# Patient Record
Sex: Male | Born: 1950 | Race: White | Hispanic: No | Marital: Married | Smoking: Never smoker
Health system: Southern US, Community
[De-identification: ages and names within clinical notes are randomized; demographics above are authoritative.]

## PROBLEM LIST (undated history)

## (undated) DIAGNOSIS — Z973 Presence of spectacles and contact lenses: Secondary | ICD-10-CM

## (undated) DIAGNOSIS — N411 Chronic prostatitis: Secondary | ICD-10-CM

## (undated) DIAGNOSIS — R972 Elevated prostate specific antigen [PSA]: Secondary | ICD-10-CM

## (undated) DIAGNOSIS — E291 Testicular hypofunction: Secondary | ICD-10-CM

## (undated) DIAGNOSIS — K2270 Barrett's esophagus without dysplasia: Secondary | ICD-10-CM

## (undated) DIAGNOSIS — C4492 Squamous cell carcinoma of skin, unspecified: Secondary | ICD-10-CM

## (undated) DIAGNOSIS — N400 Enlarged prostate without lower urinary tract symptoms: Secondary | ICD-10-CM

## (undated) DIAGNOSIS — K449 Diaphragmatic hernia without obstruction or gangrene: Secondary | ICD-10-CM

## (undated) DIAGNOSIS — N200 Calculus of kidney: Secondary | ICD-10-CM

## (undated) DIAGNOSIS — K219 Gastro-esophageal reflux disease without esophagitis: Secondary | ICD-10-CM

## (undated) DIAGNOSIS — M1990 Unspecified osteoarthritis, unspecified site: Secondary | ICD-10-CM

## (undated) DIAGNOSIS — N419 Inflammatory disease of prostate, unspecified: Secondary | ICD-10-CM

## (undated) DIAGNOSIS — E785 Hyperlipidemia, unspecified: Secondary | ICD-10-CM

## (undated) HISTORY — DX: Hyperlipidemia, unspecified: E78.5

## (undated) HISTORY — DX: Benign prostatic hyperplasia without lower urinary tract symptoms: N40.0

## (undated) HISTORY — PX: PROSTATE SURGERY: SHX751

## (undated) HISTORY — DX: Barrett's esophagus without dysplasia: K22.70

## (undated) HISTORY — DX: Calculus of kidney: N20.0

## (undated) HISTORY — DX: Testicular hypofunction: E29.1

## (undated) HISTORY — DX: Chronic prostatitis: N41.1

## (undated) HISTORY — DX: Elevated prostate specific antigen (PSA): R97.20

## (undated) HISTORY — DX: Diaphragmatic hernia without obstruction or gangrene: K44.9

## (undated) HISTORY — PX: SHOULDER SURGERY: SHX246

## (undated) HISTORY — DX: Inflammatory disease of prostate, unspecified: N41.9

## (undated) HISTORY — PX: HERNIA REPAIR: SHX51

---

## 2003-12-09 HISTORY — PX: HERNIA REPAIR: SHX51

## 2007-03-23 ENCOUNTER — Ambulatory Visit: Payer: Self-pay | Admitting: Urology

## 2007-03-23 IMAGING — CR DG ABDOMEN 1V
1 series · 2 of 2 positions shown · non-contrast
Comparison: none

REASON FOR EXAM: xray kub nephrolithiasis pt need films
COMMENTS:

[Series 1: view not recorded · 0.17mm/px · 2 of 2 slices shown]
[im 1/2]
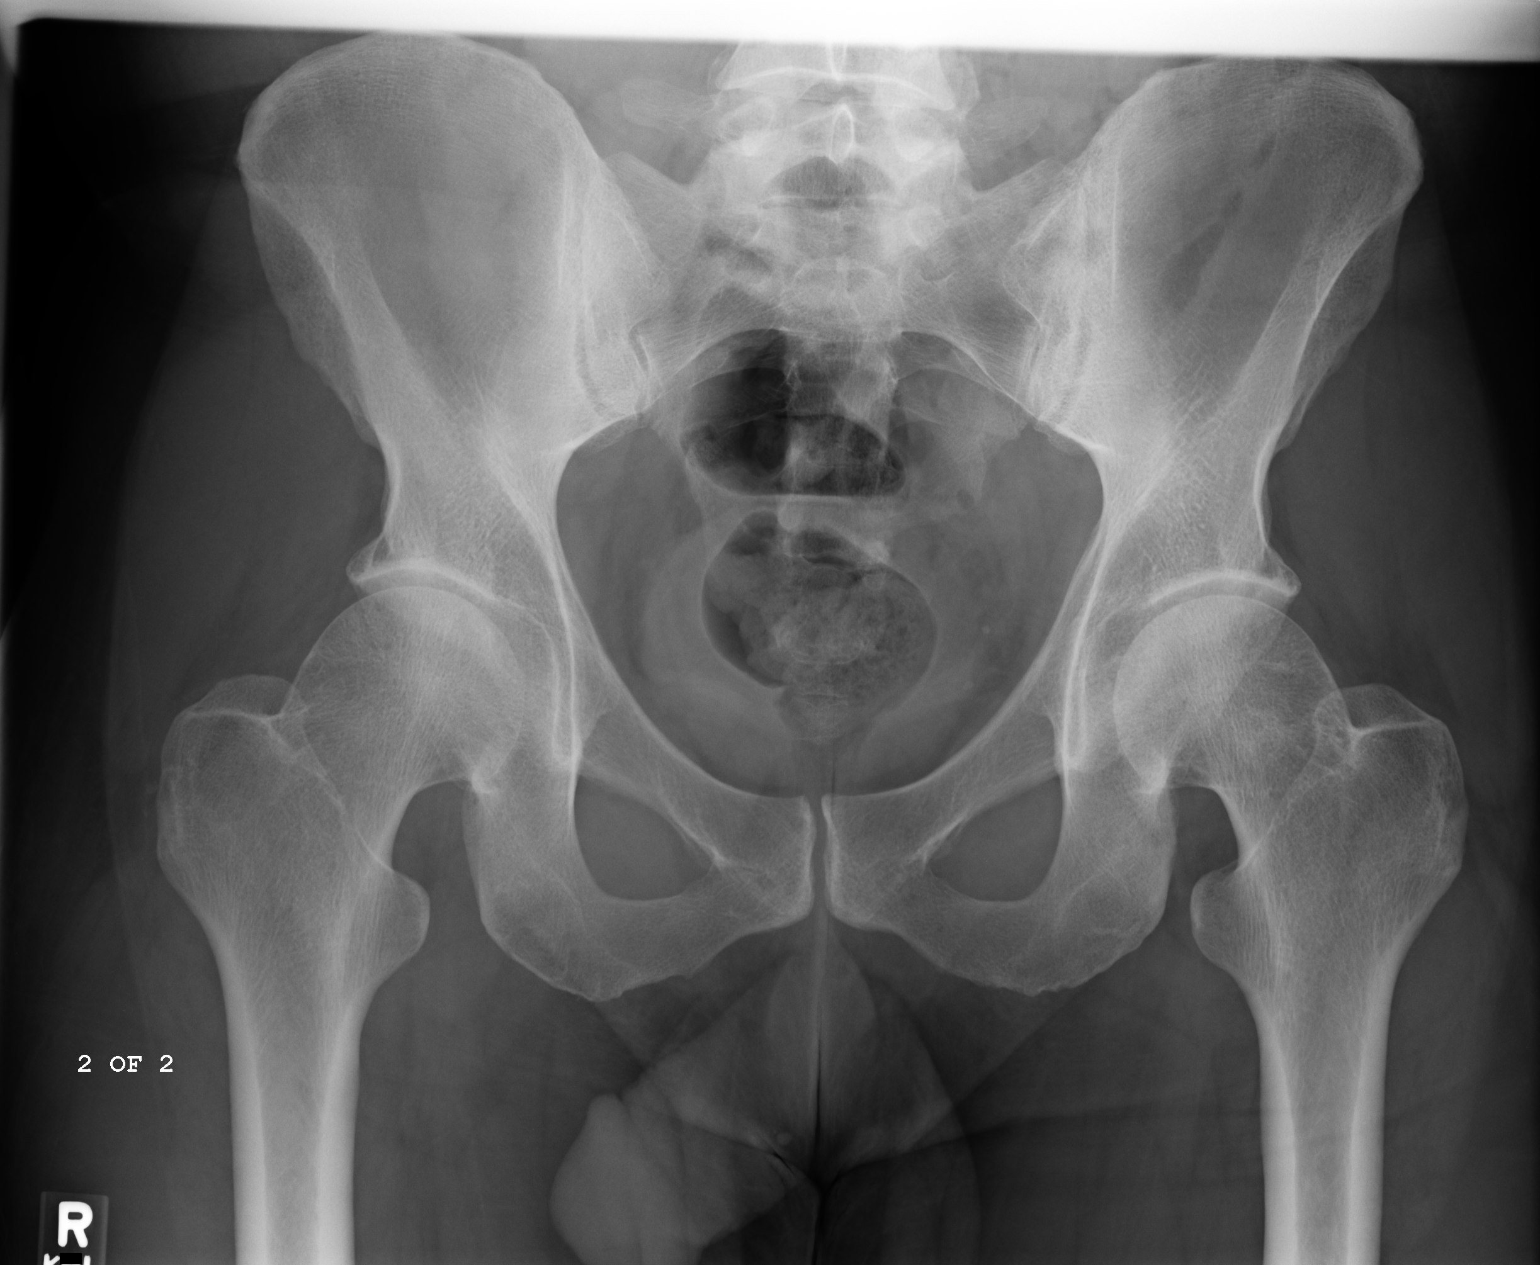
[im 2/2]
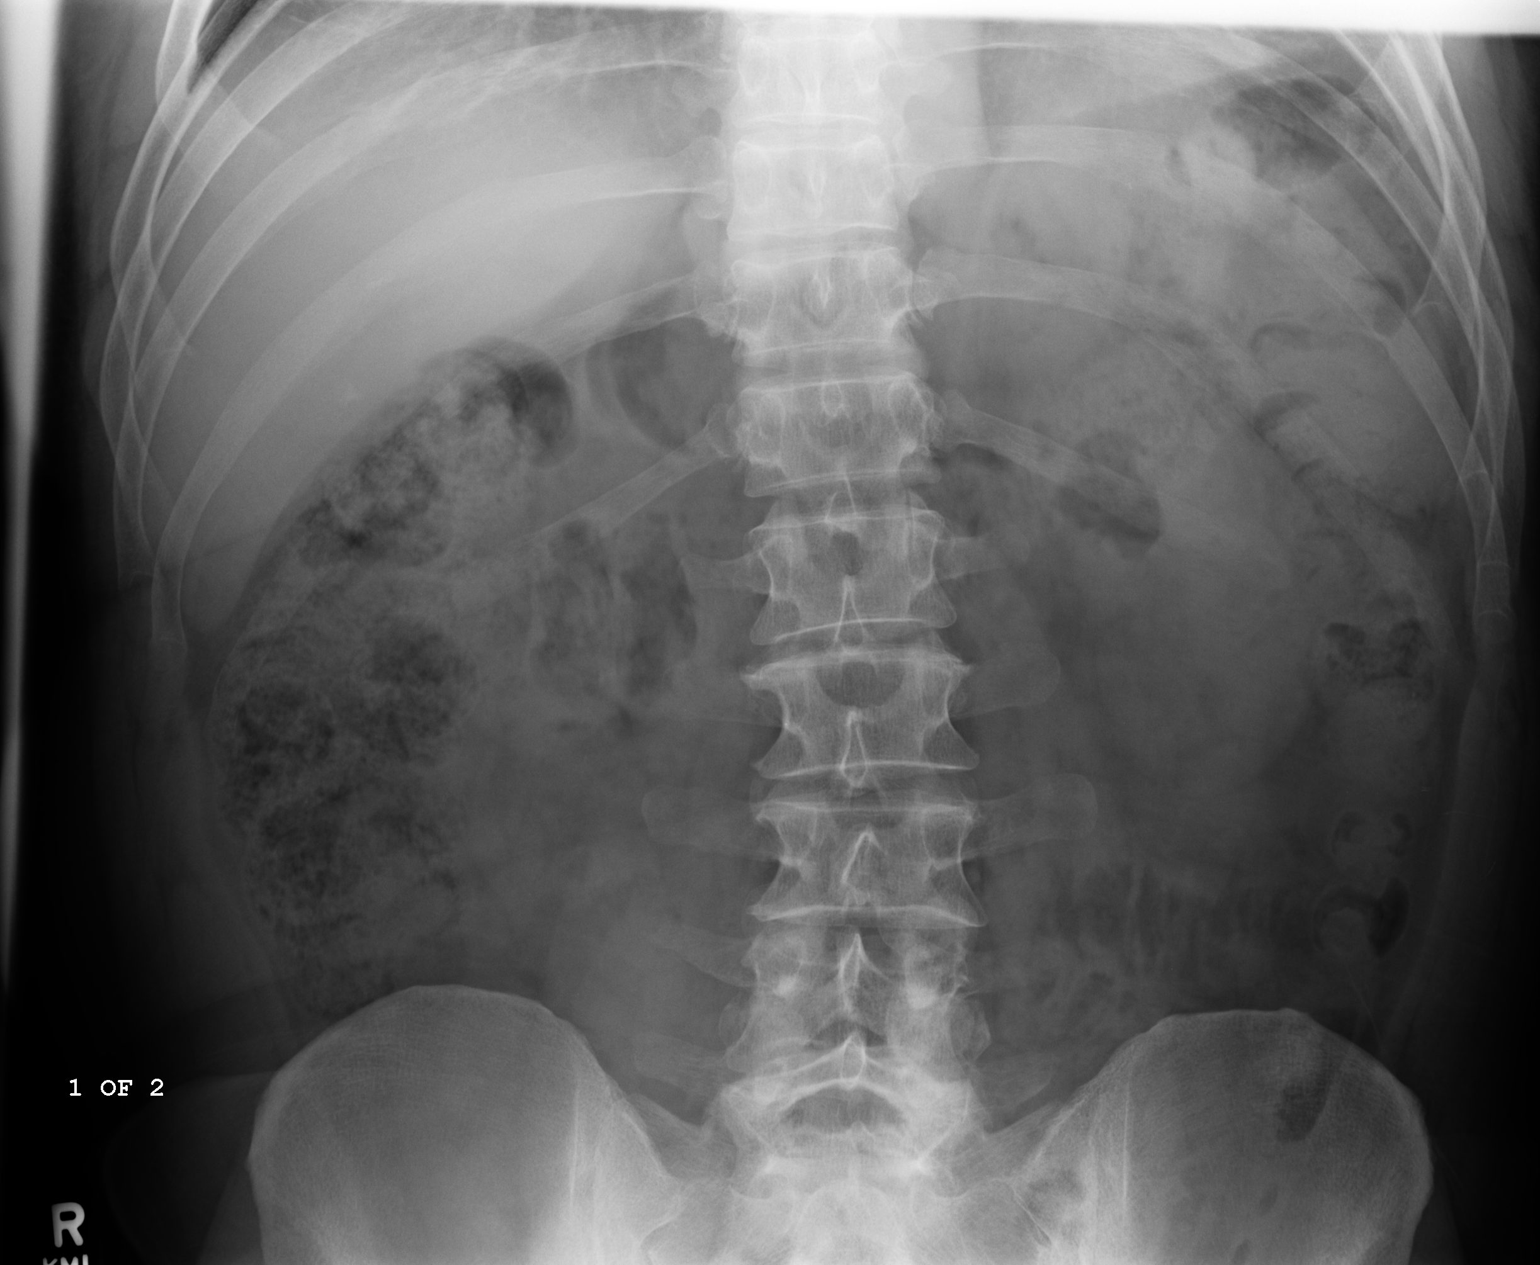

[2 of 2 positions shown; findings below may reference images not displayed]

PROCEDURE:     DXR - DXR KIDNEY URETER BLADDER  - [DATE]  [DATE]

RESULT:      AP views of the abdomen and of the pelvis were obtained. The
clinically reported nephrolithiasis is not definitely identified on plain
film examination. The reported calcifications apparently are faintly
calcified or are obscured by overlying bowel and bowel content.
IMPRESSION: Please see above.

## 2007-12-14 DIAGNOSIS — K469 Unspecified abdominal hernia without obstruction or gangrene: Secondary | ICD-10-CM | POA: Insufficient documentation

## 2008-01-26 ENCOUNTER — Encounter: Admission: RE | Admit: 2008-01-26 | Discharge: 2008-01-26 | Payer: Self-pay | Admitting: General Surgery

## 2008-06-01 ENCOUNTER — Ambulatory Visit: Payer: Self-pay | Admitting: Gastroenterology

## 2010-01-09 ENCOUNTER — Emergency Department: Payer: Self-pay | Admitting: Emergency Medicine

## 2012-08-31 DIAGNOSIS — R972 Elevated prostate specific antigen [PSA]: Secondary | ICD-10-CM | POA: Insufficient documentation

## 2012-08-31 DIAGNOSIS — R339 Retention of urine, unspecified: Secondary | ICD-10-CM | POA: Insufficient documentation

## 2012-08-31 DIAGNOSIS — E291 Testicular hypofunction: Secondary | ICD-10-CM | POA: Insufficient documentation

## 2012-08-31 DIAGNOSIS — N138 Other obstructive and reflux uropathy: Secondary | ICD-10-CM | POA: Insufficient documentation

## 2012-12-29 IMAGING — CR DG ABDOMEN 1V
1 series · 2 of 2 positions shown · non-contrast
Comparison: none

REASON FOR EXAM: NEPHROLITHASIS RENAL COLIC
COMMENTS:

[Series 1: t abdomen supine · 0.14mm/px · 2 of 2 slices shown]
[im 1/2]
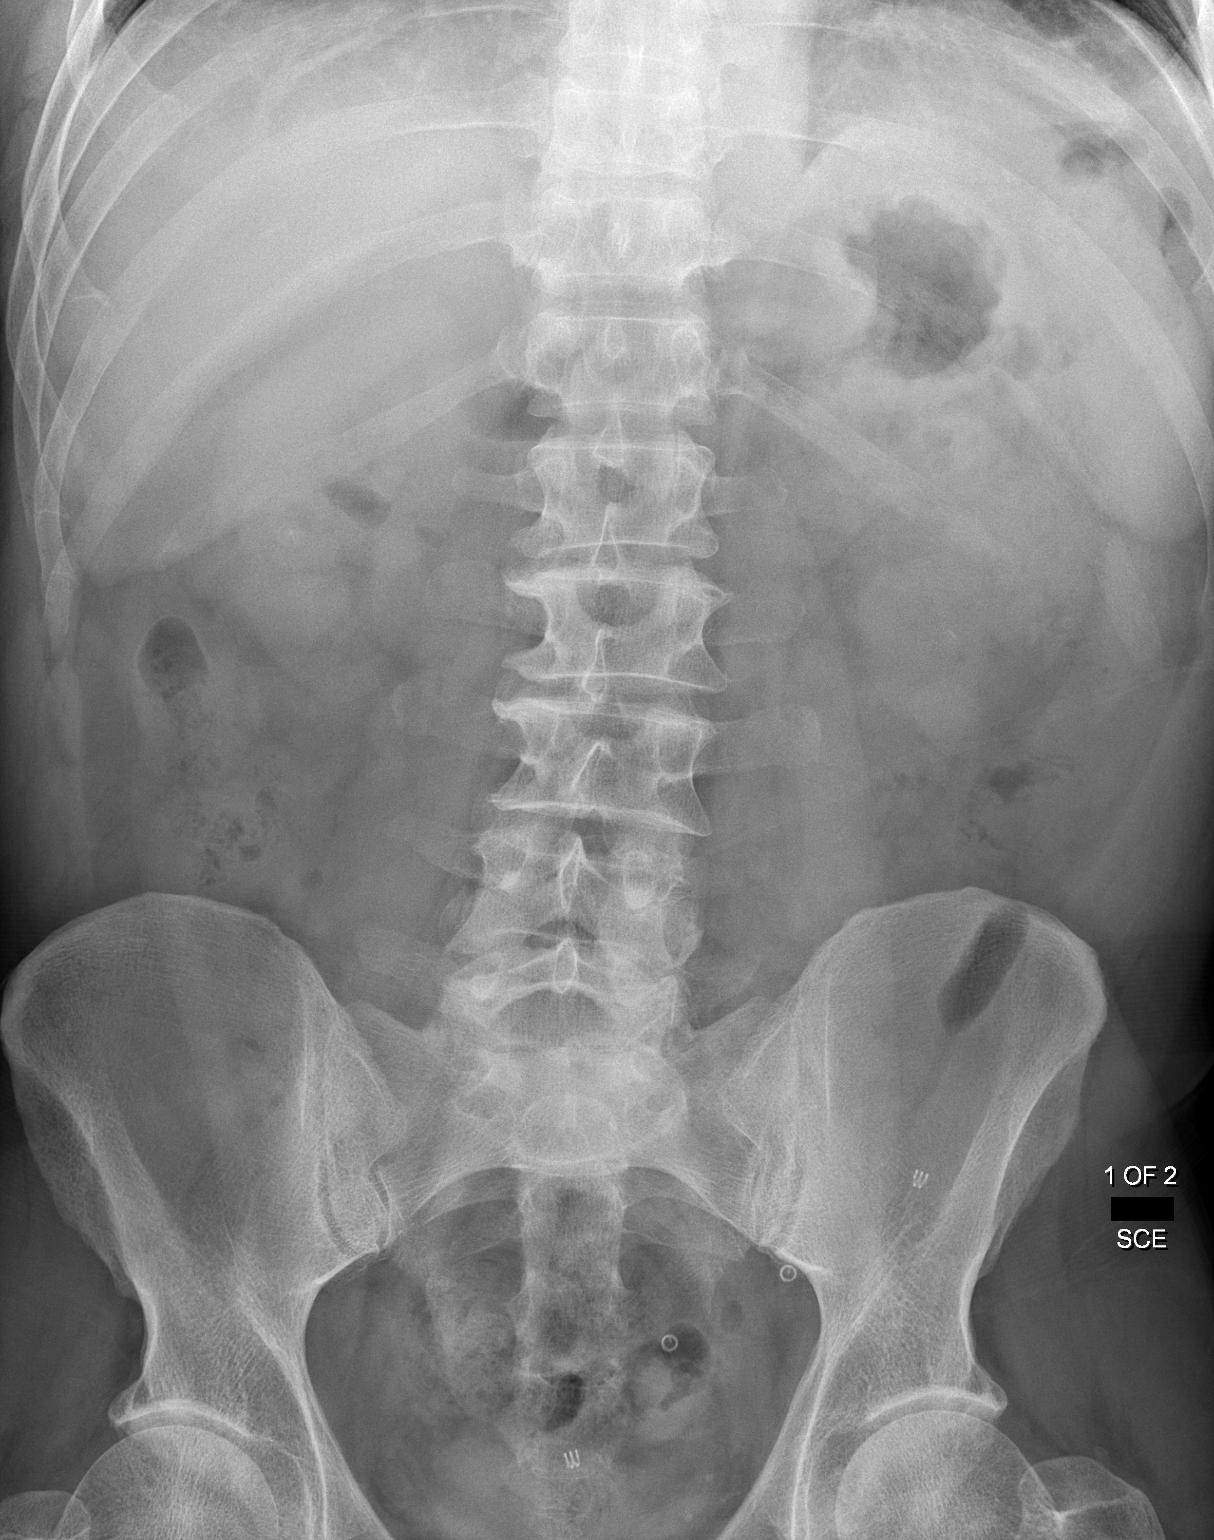
[im 2/2]
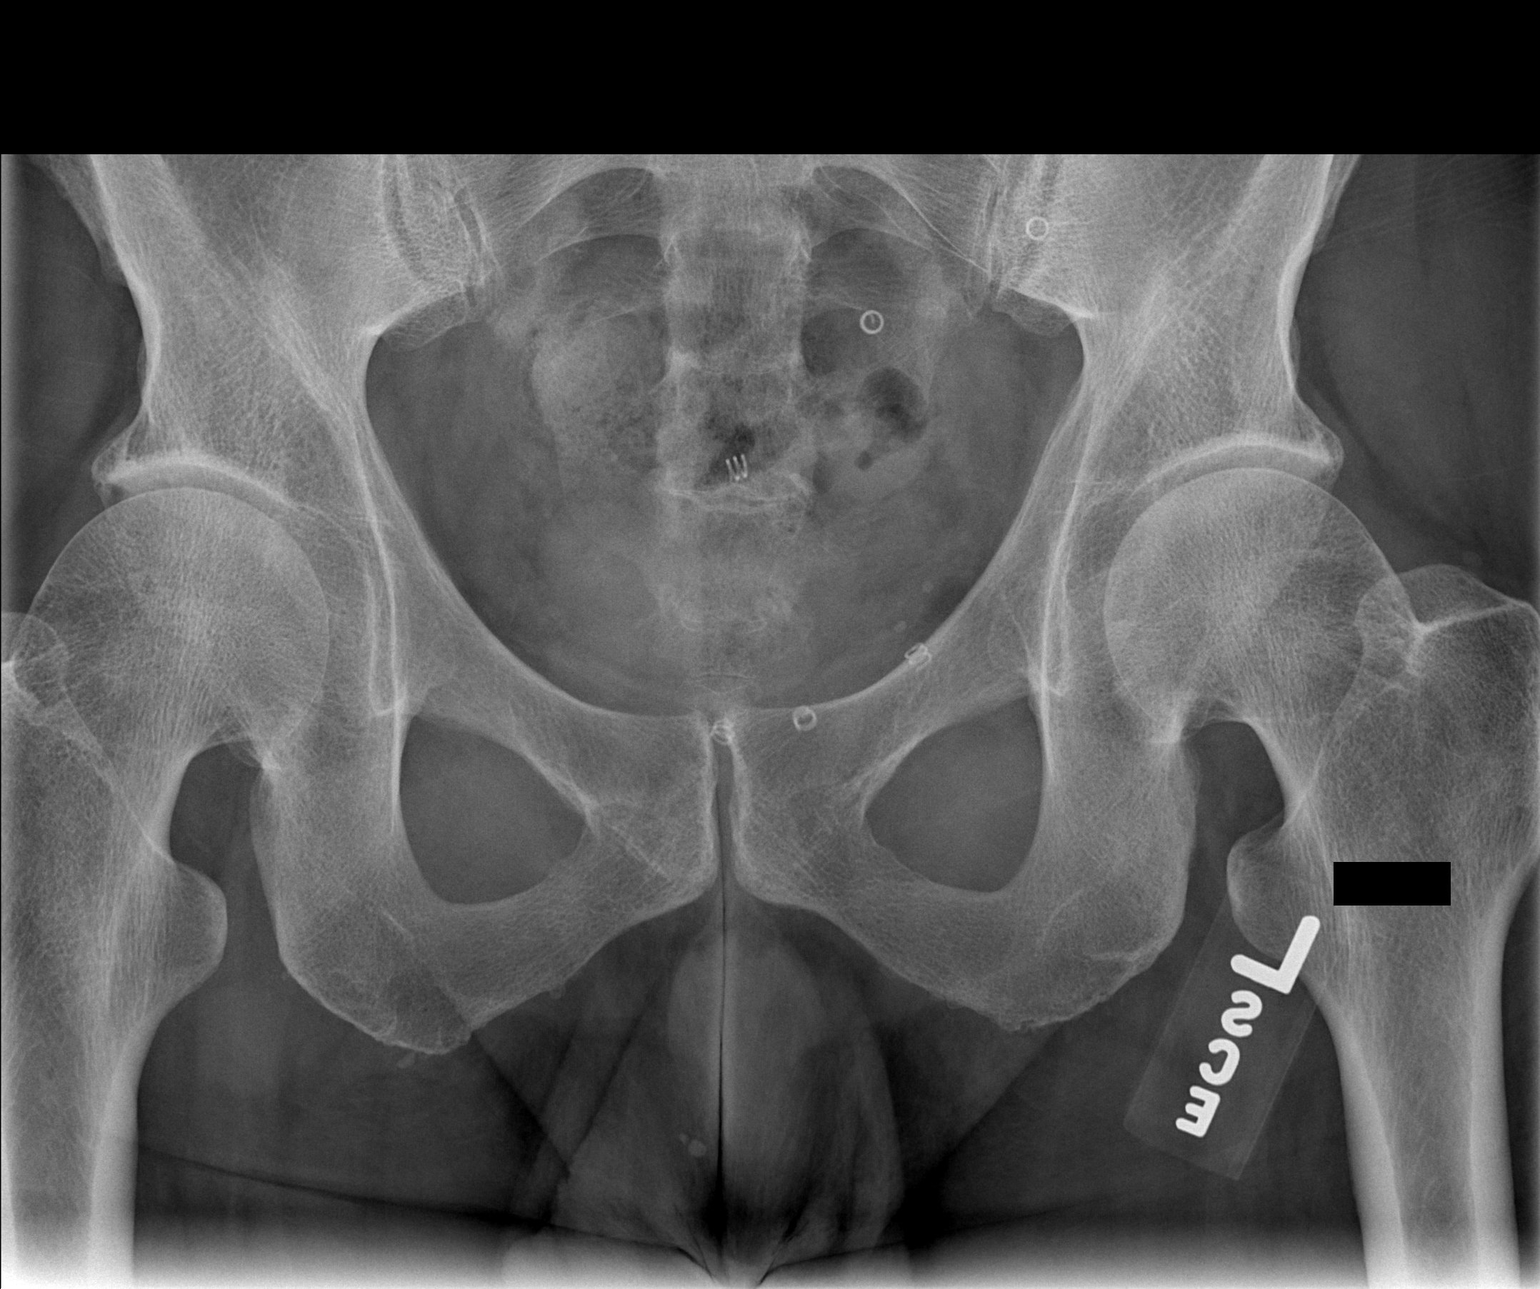

[2 of 2 positions shown; findings below may reference images not displayed]

PROCEDURE:     DXR - DXR KIDNEY URETER BLADDER  - [DATE]  [DATE]

RESULT:     Right nephrolithiasis and left nephrolithiasis appear present.
No definite ureteral stones are evident. Coils are present over the left
pelvis from previous hernia repair surgery. The bowel gas pattern is
unremarkable. Bony structures appear unremarkable.
IMPRESSION: 1. I lateral nephrolithiasis. No definite ureterolithiasis.

[REDACTED]

## 2013-04-07 ENCOUNTER — Ambulatory Visit: Payer: Self-pay | Admitting: Urology

## 2013-04-07 DIAGNOSIS — N200 Calculus of kidney: Secondary | ICD-10-CM | POA: Insufficient documentation

## 2013-04-07 DIAGNOSIS — N201 Calculus of ureter: Secondary | ICD-10-CM | POA: Insufficient documentation

## 2013-04-07 DIAGNOSIS — R310 Gross hematuria: Secondary | ICD-10-CM | POA: Insufficient documentation

## 2013-04-07 IMAGING — CR DG ABDOMEN 1V
1 series · 2 of 2 positions shown · non-contrast
Comparison: none

REASON FOR EXAM: Nephrolithaisis
COMMENTS:

PROCEDURE:     DXR - DXR KIDNEY URETER BLADDER  - [DATE] [DATE]
RESULT:     Comparison: [DATE]

[Series 1: supine kub · 0.17mm/px · 2 of 2 slices shown]
[im 1/2]
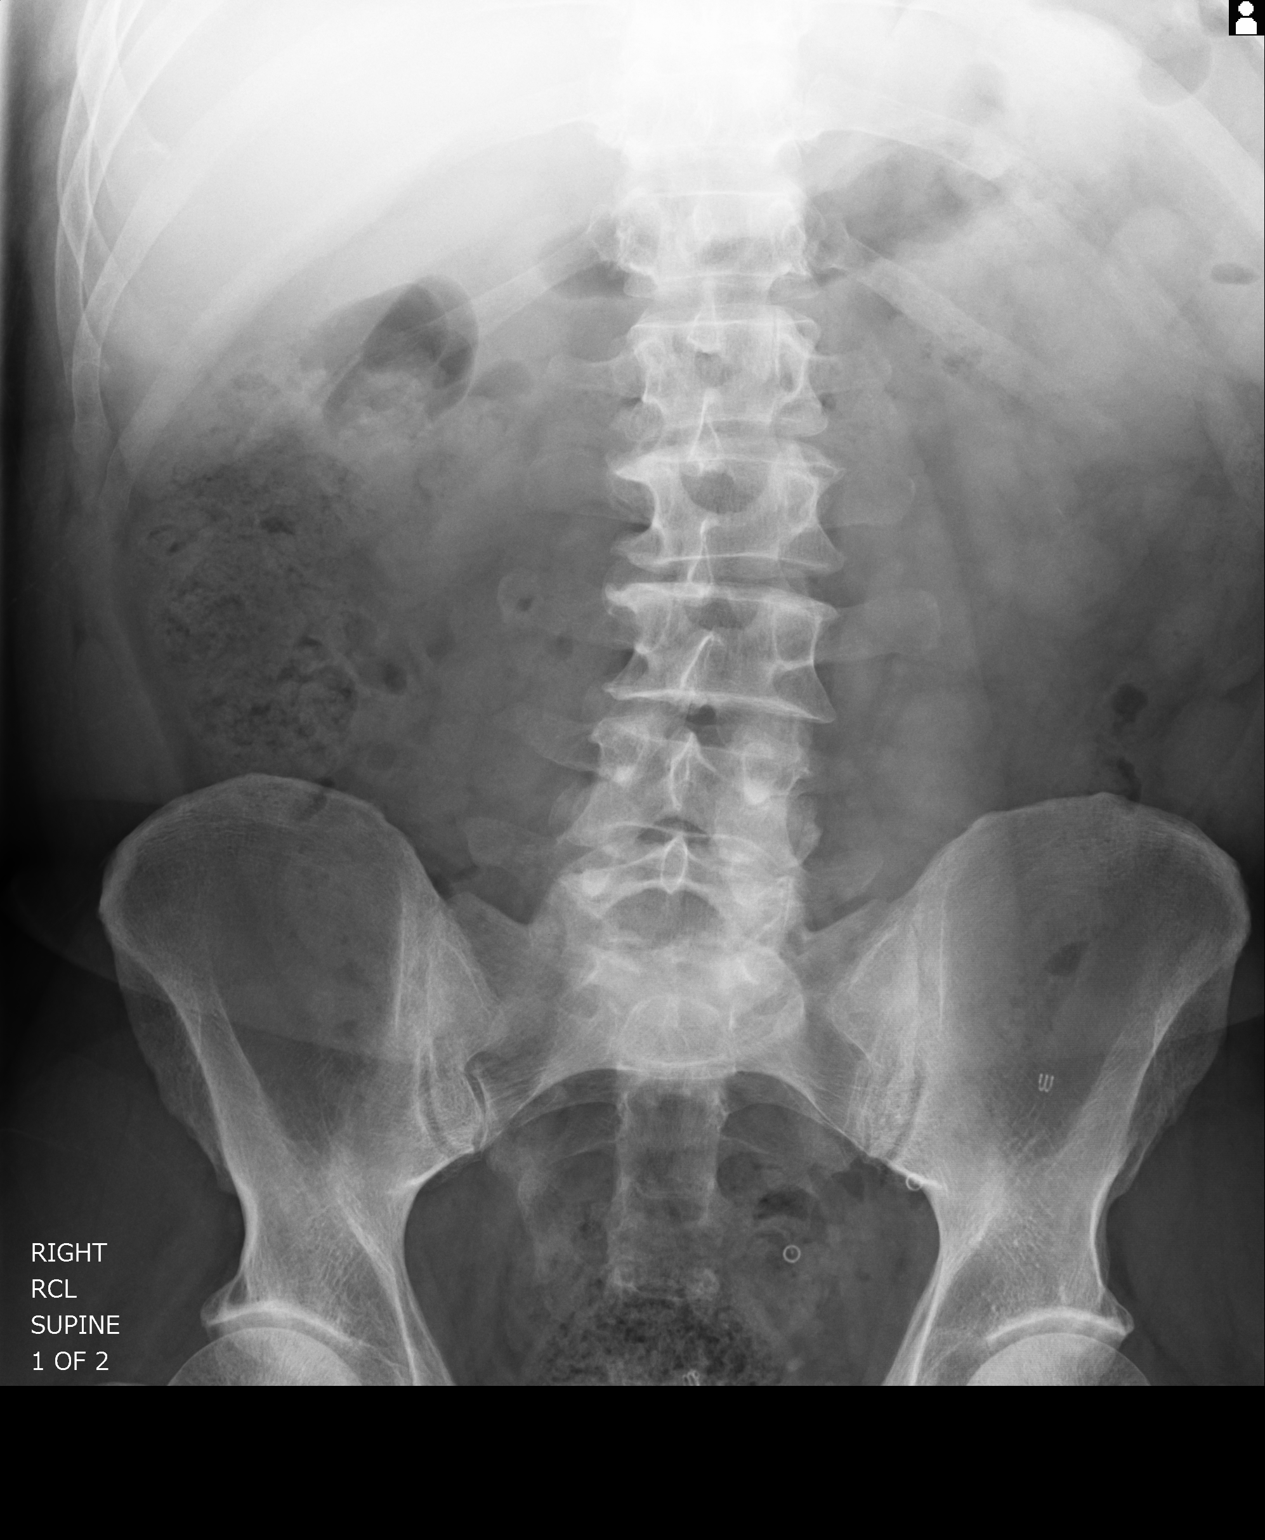
[im 2/2]
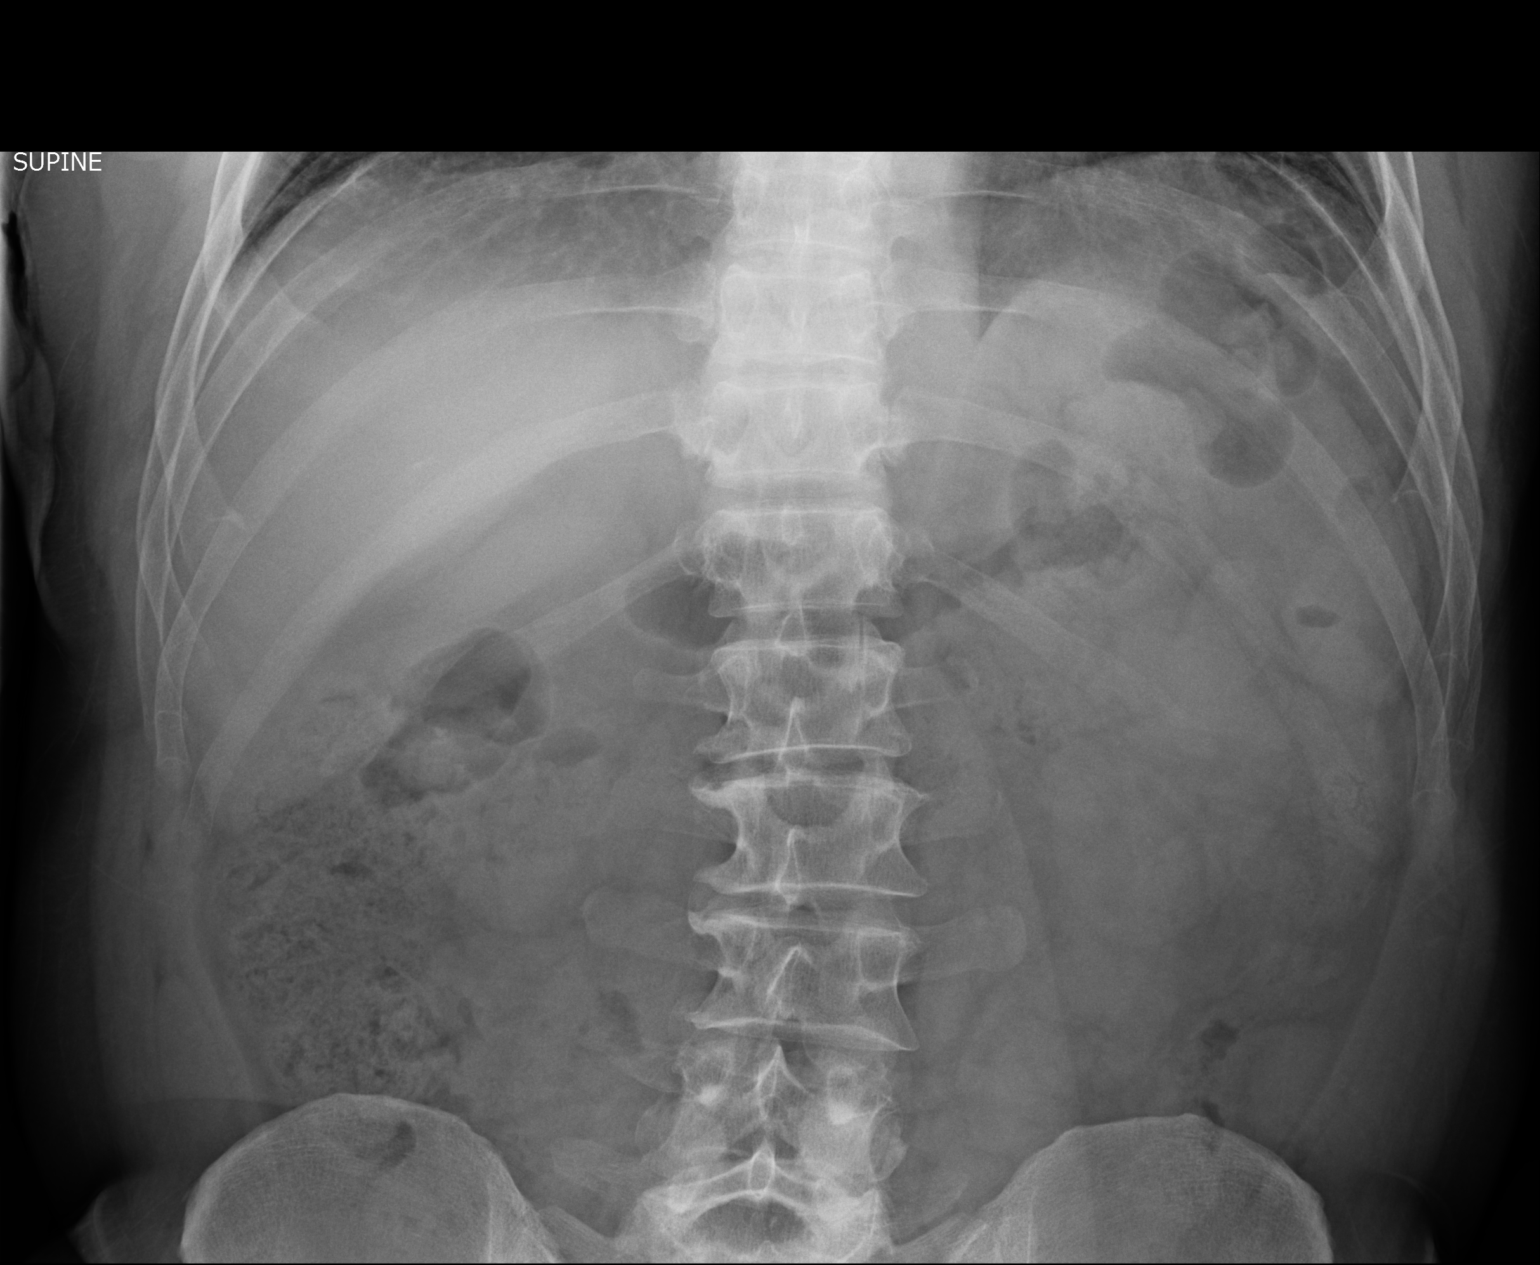

[2 of 2 positions shown; findings below may reference images not displayed]

FINDINGS: There are a few minimal densities overlying the left kidney which could
represent renal calculi. Evaluation of the right kidney is limited by
overlying stool and bowel gas. There is mild levocurvature lumbar spine.
Calcifications in the pelvis likely represent phleboliths.  A distal
ureteral calculus would be difficult to exclude.. Stool is in the rectum.
Postoperative changes seen from left hernia mesh repair.
IMPRESSION: Possible left-sided nephrolithiasis.

[REDACTED]

## 2013-04-13 ENCOUNTER — Ambulatory Visit: Payer: Self-pay | Admitting: Urology

## 2013-04-19 ENCOUNTER — Ambulatory Visit: Payer: Self-pay | Admitting: Urology

## 2013-05-27 DIAGNOSIS — S61219A Laceration without foreign body of unspecified finger without damage to nail, initial encounter: Secondary | ICD-10-CM | POA: Insufficient documentation

## 2013-07-15 ENCOUNTER — Ambulatory Visit: Payer: Self-pay | Admitting: Urology

## 2013-08-29 ENCOUNTER — Encounter (INDEPENDENT_AMBULATORY_CARE_PROVIDER_SITE_OTHER): Payer: Self-pay | Admitting: General Surgery

## 2013-08-29 ENCOUNTER — Ambulatory Visit (INDEPENDENT_AMBULATORY_CARE_PROVIDER_SITE_OTHER): Payer: No Typology Code available for payment source | Admitting: General Surgery

## 2013-08-29 ENCOUNTER — Other Ambulatory Visit (INDEPENDENT_AMBULATORY_CARE_PROVIDER_SITE_OTHER): Payer: Self-pay | Admitting: General Surgery

## 2013-08-29 VITALS — BP 124/76 | HR 68 | Temp 97.6°F | Resp 14 | Ht 70.0 in | Wt 204.8 lb

## 2013-08-29 DIAGNOSIS — R1310 Dysphagia, unspecified: Secondary | ICD-10-CM

## 2013-08-29 NOTE — Patient Instructions (Signed)
We will call you with the results of your x-ray.  You may need an upper endoscopy by a Gastroenterologist.

## 2013-08-29 NOTE — Progress Notes (Signed)
Patient ID: Lucas Christensen, male   DOB: [birth year], 62 y.o.   MRN: [id number]  Chief Complaint  Patient presents with  . New Evaluation    EPNP, eval H hernia    HPI Lucas Christensen is a 62 y.o. male.   HPI  He is referred by Lucas Christensen for further evaluation and treatment of left groin pain possible recurrent inguinal hernia. He underwent a laparoscopic left inguinal hernia repair with mesh in the past. He's been having problems with kidney stones. He underwent a CT scan which apparently showed something in the left inguinal area. I do not have access to the CT scan. He states when he exerts himself heavily he does have some discomfort in the left groin. He also complains of having some dysphagia and states he has a hiatal hernia which he thinks is shown on the CT scan as well. He does not have any heartburn.  Past Medical History  Diagnosis Date  . Elevated PSA   . BPH (benign prostatic hyperplasia)   . Chronic prostatitis   . Testicular hypofunction   . Calculus of both kidneys   . Hyperlipidemia     Past Surgical History  Procedure Laterality Date  . Shoulder surgery Left   . Hernia repair      History reviewed. No pertinent family history.  Social History History  Substance Use Topics  . Smoking status: Never Smoker   . Smokeless tobacco: Never Used  . Alcohol Use: No    No Known Allergies  Current Outpatient Prescriptions  Medication Sig Dispense Refill  . Azelaic Acid (FINACEA) 15 % cream Apply topically 2 (two) times daily. After skin is thoroughly washed and patted dry, gently but thoroughly massage a thin film of azelaic acid cream into the affected area twice daily, in the morning and evening.      . tamsulosin (FLOMAX) 0.4 MG CAPS capsule        No current facility-administered medications for this visit.    Review of Systems Review of Systems  Constitutional: Negative for unexpected weight change.  HENT: Positive for trouble swallowing.   Respiratory:  Negative.   Cardiovascular: Negative.   Gastrointestinal: Negative.     Blood pressure 124/76, pulse 68, temperature 97.6 F (36.4 C), temperature source Temporal, resp. rate 14, height 5\' 10"  (1.778 m), weight 204 lb 12.8 oz (92.897 kg).  Physical Exam Physical Exam  Constitutional: He appears well-developed and well-nourished. No distress.  Neck: Neck supple.  Cardiovascular: Normal rate and regular rhythm.   Pulmonary/Chest: Effort normal and breath sounds normal.  Abdominal: Soft. He exhibits no distension and no mass. There is no tenderness.  Genitourinary:  No left or right inguinal bulges that are consistent with a hernia  Lymphadenopathy:    He has no cervical adenopathy.    Data Reviewed Notes from Dr. Achilles Christensen. CT scan report but no CT images.  Assessment    #1. Intermittent left groin pain with heavy activity. No clinical evidence of hernia.  #2. Dysphagia     Plan    Obtain a disc of the CT scan so I can look at the images. Order a barium swallow. We'll call him and discuss results and need for further workup including possible gastroenterology consult and upper endoscopy.        Lucas Christensen J 08/29/2013, 3:44 PM

## 2013-09-05 ENCOUNTER — Ambulatory Visit
Admission: RE | Admit: 2013-09-05 | Discharge: 2013-09-05 | Disposition: A | Discharge status: Home / Self Care | Payer: No Typology Code available for payment source | Source: Ambulatory Visit | Attending: General Surgery | Admitting: General Surgery

## 2013-09-05 DIAGNOSIS — R1310 Dysphagia, unspecified: Secondary | ICD-10-CM

## 2013-09-05 IMAGING — RF DG ESOPHAGUS
8 of 9 series · 19 of 24 positions shown · non-contrast
Comparison: None.

FLUOROSCOPY TIME:  Two Min 24 seconds

CLINICAL DATA: Dysphasia

EXAM:
ESOPHOGRAM/BARIUM SWALLOW
TECHNIQUE: Combined double contrast and single contrast examination performed
using effervescent crystals, thick barium liquid, and thin barium
liquid.

[Series 1: run · 6 of 19 slices shown (1 of 8)]
[im 1/19]
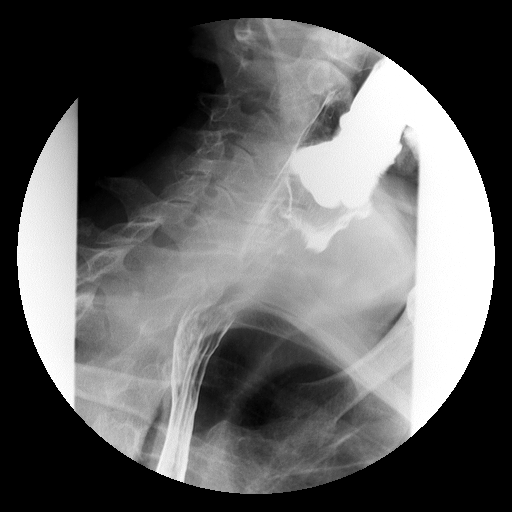
[im 3/19]
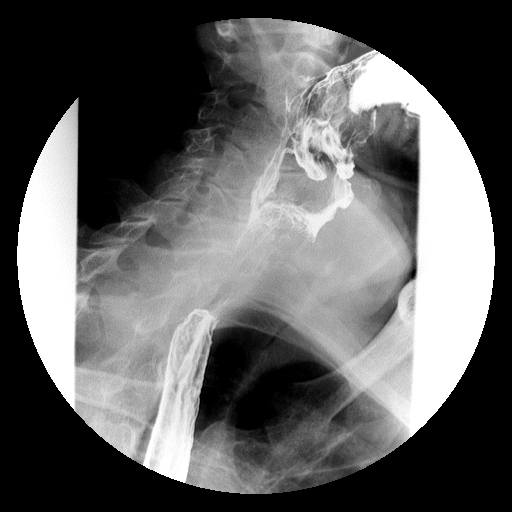
[im 8/19]
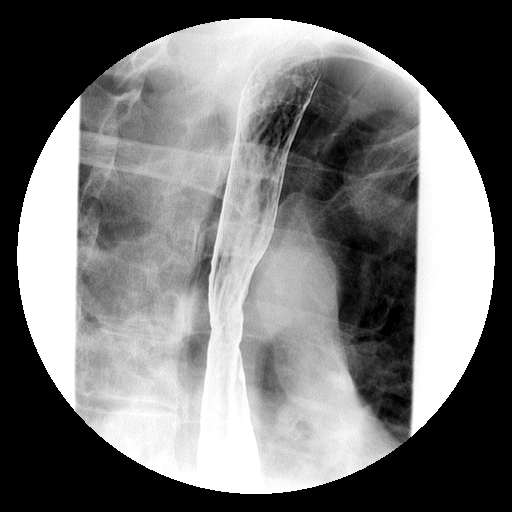
[im 11/19]
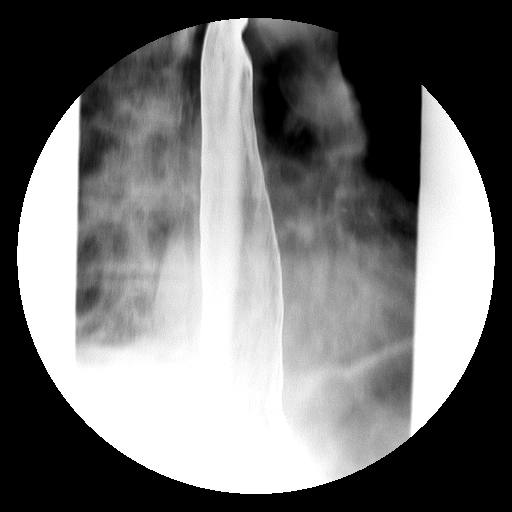
[im 13/19]
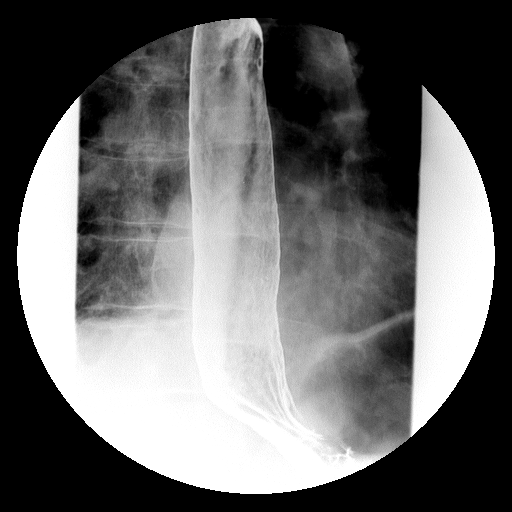
[im 16/19]
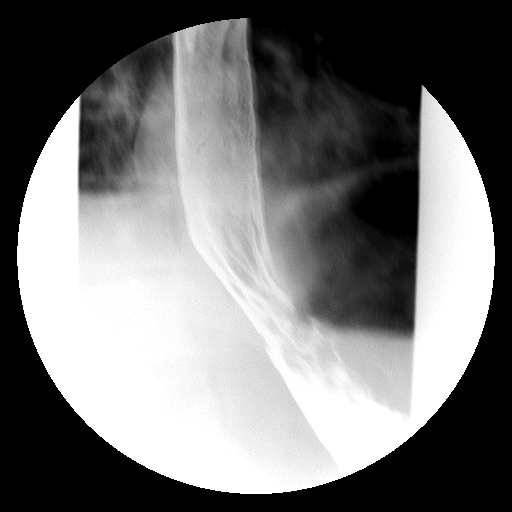

[Series 2: run · 1 of 1 slices shown (2 of 8)]
[im 1/1]
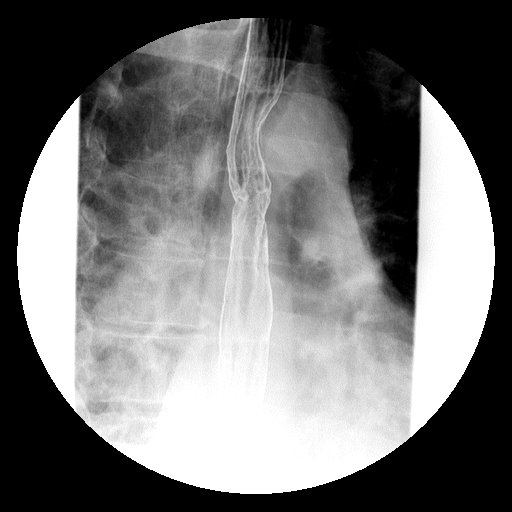

[Series 3: run · 1 of 1 slices shown (3 of 8)]
[im 1/1]
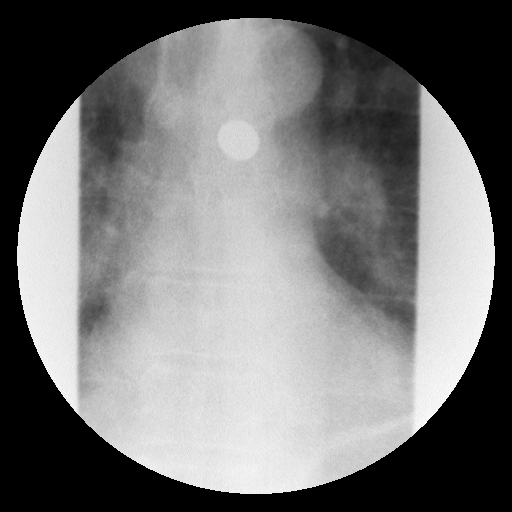

[Series 4: run · 1 of 1 slices shown (4 of 8)]
[im 1/1]
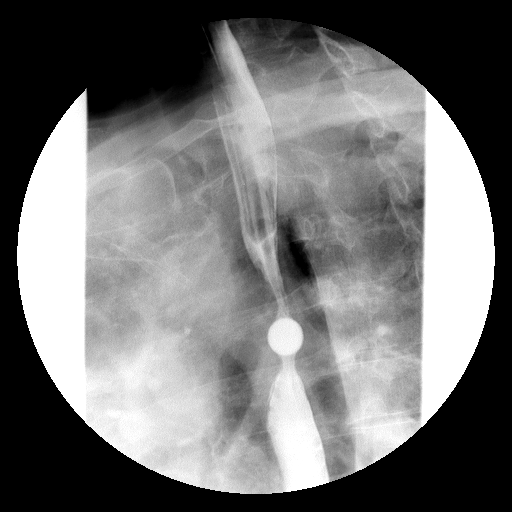

[Series 6: run · 1 of 1 slices shown (5 of 8)]
[im 1/1]
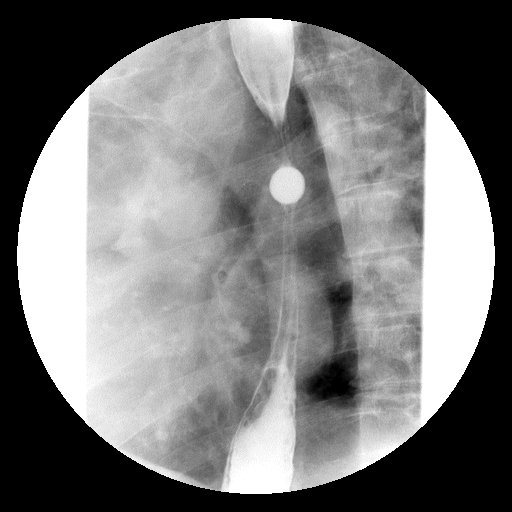

[Series 7: run · 1 of 1 slices shown (6 of 8)]
[im 1/1]
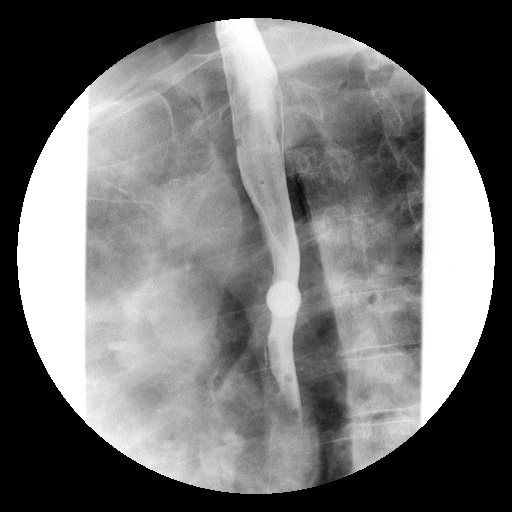

[Series 8: run · 1 of 1 slices shown (7 of 8)]
[im 1/1]
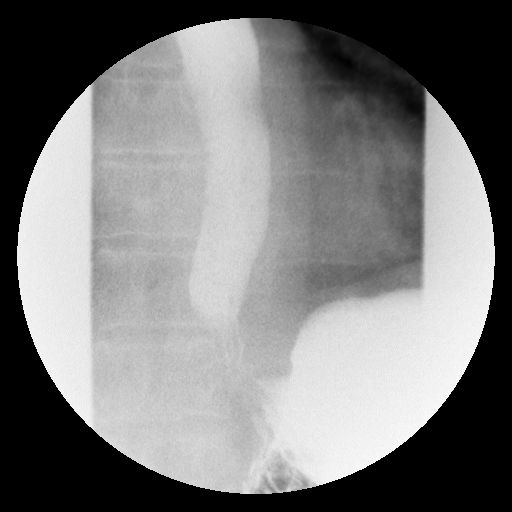

[Series 9: run · 7 of 18 slices shown (8 of 8)]
[im 1/18]
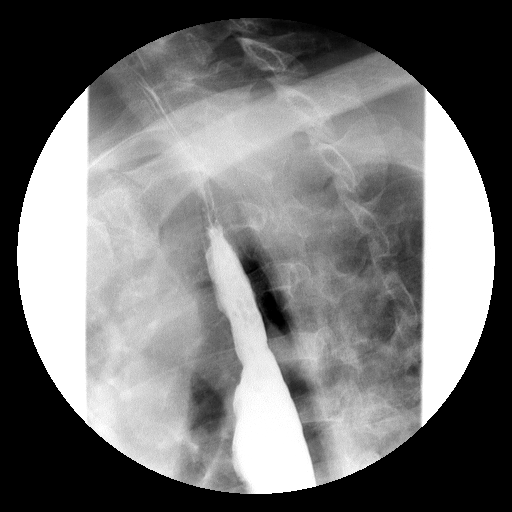
[im 5/18]
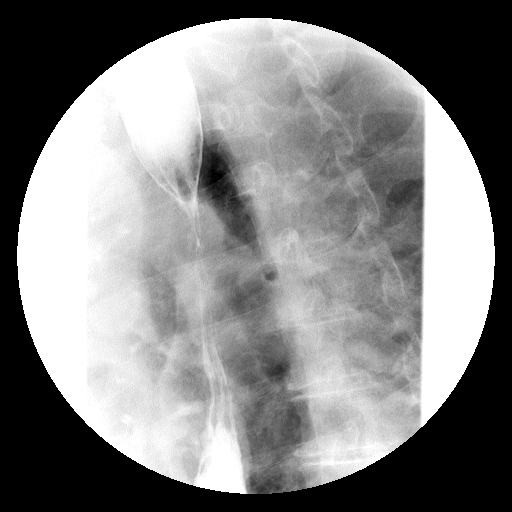
[im 7/18]
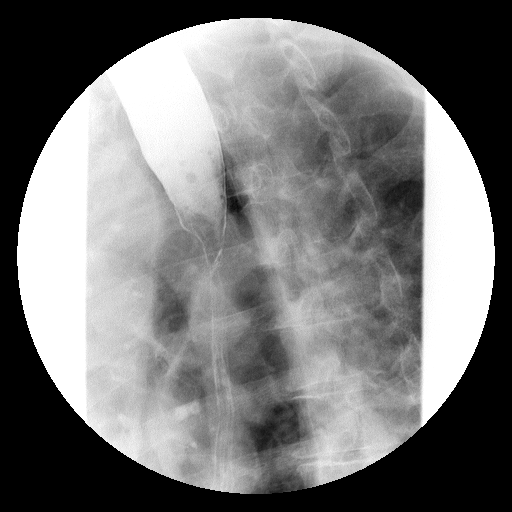
[im 9/18]
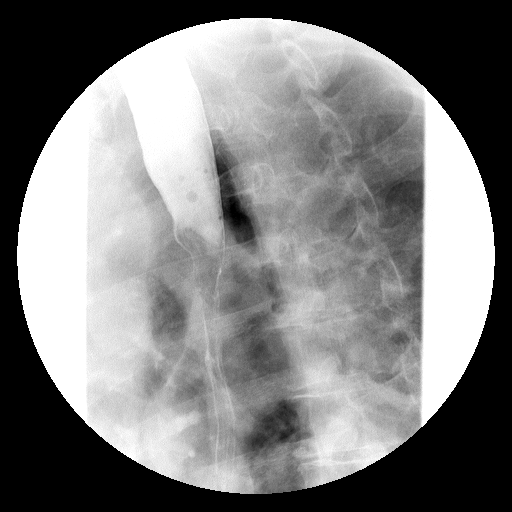
[im 11/18]
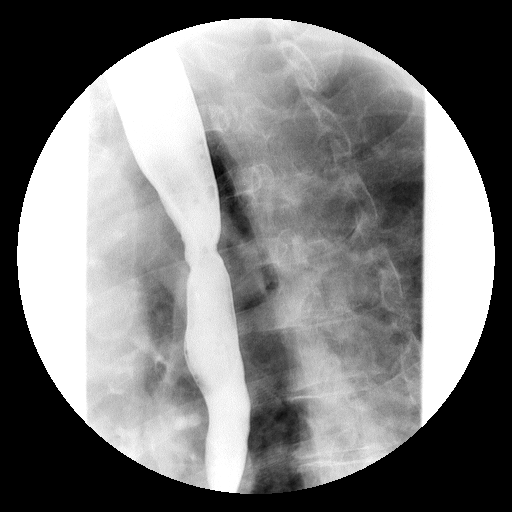
[im 15/18]
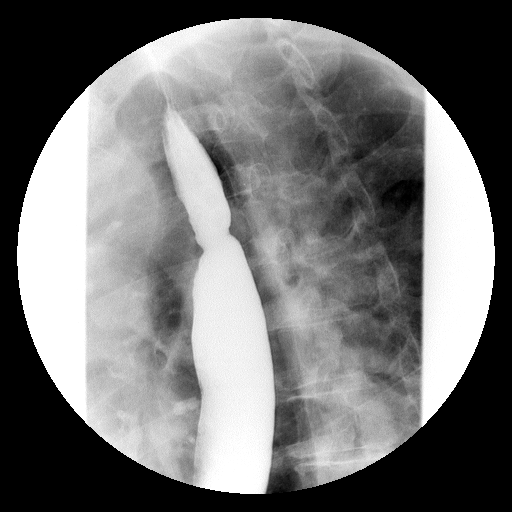
[im 18/18]
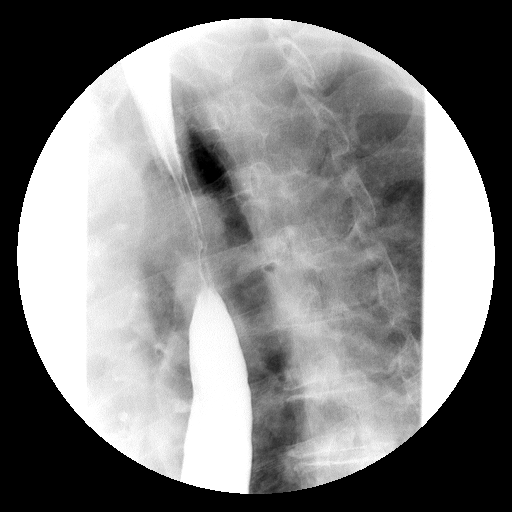

[19 of 24 positions shown; findings below may reference images not displayed]

FINDINGS: Stricture in the mid esophagus is noted. There is mild irregularity
of the mucosa. No ulceration. Barium tablet did not pass this area
for several min prior to dissolving and passing. This is located in
the mid esophagus just below the aortic arch.

There is a hiatal hernia and gastroesophageal reflux.
IMPRESSION: Mild stricture in the mid esophagus. FRANC mucosa shows mild
irregularity in this area which may be related to Barrett's
esophagus. Benign stricture and neoplasm also in the differential.
Endoscopy and biopsy suggested.

Hiatal hernia with reflux.

## 2013-09-06 ENCOUNTER — Encounter (INDEPENDENT_AMBULATORY_CARE_PROVIDER_SITE_OTHER): Payer: Self-pay | Admitting: General Surgery

## 2013-09-06 ENCOUNTER — Other Ambulatory Visit (INDEPENDENT_AMBULATORY_CARE_PROVIDER_SITE_OTHER): Payer: Self-pay | Admitting: General Surgery

## 2013-09-06 DIAGNOSIS — R1310 Dysphagia, unspecified: Secondary | ICD-10-CM

## 2013-09-06 NOTE — Progress Notes (Signed)
Patient ID: Lucas Christensen, male   DOB: 07/12/1951, 62 y.o.   MRN: [id number] I spoke with him today. I reviewed the CT scan and do not feel that is consistent with an inguinal hernia. 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 However, his barium swallow does demonstrate an area of narrowing in the mid esophagus that needs further investigation. I recommended referral to a gastroenterologist for upper endoscopy and he is in agreement with this. We will make these arrangements.

## 2013-09-08 ENCOUNTER — Telehealth (INDEPENDENT_AMBULATORY_CARE_PROVIDER_SITE_OTHER): Payer: Self-pay

## 2013-09-08 NOTE — Telephone Encounter (Signed)
Calling to confirm pt's appt with Dr. Ewing Schlein on 10/03/13 at 2:30pm.  Dr. Abbey Chatters has spoken with Dr. Ewing Schlein directly, and the appointment may be moved up sooner.  Pt will hear from Dr. Marlane Hatcher office if any changes.

## 2013-10-24 ENCOUNTER — Encounter (INDEPENDENT_AMBULATORY_CARE_PROVIDER_SITE_OTHER): Payer: Self-pay

## 2013-11-21 ENCOUNTER — Encounter (INDEPENDENT_AMBULATORY_CARE_PROVIDER_SITE_OTHER): Payer: Self-pay

## 2014-03-14 DIAGNOSIS — R350 Frequency of micturition: Secondary | ICD-10-CM | POA: Insufficient documentation

## 2014-03-14 DIAGNOSIS — N390 Urinary tract infection, site not specified: Secondary | ICD-10-CM | POA: Insufficient documentation

## 2014-05-03 ENCOUNTER — Ambulatory Visit: Payer: Self-pay | Admitting: Urology

## 2014-05-03 DIAGNOSIS — Z0000 Encounter for general adult medical examination without abnormal findings: Secondary | ICD-10-CM | POA: Insufficient documentation

## 2014-05-03 IMAGING — CR DG ABDOMEN 1V
1 series · 2 of 2 positions shown · non-contrast
Comparison: [DATE]

CLINICAL DATA: Surgery YOSARI to remove kidney stones. Evaluate for
nephrolithiasis.

EXAM:
ABDOMEN - 1 VIEW

[Series 1: t abdomen supine · 0.14mm/px · 2 of 2 slices shown]
[im 1/2]
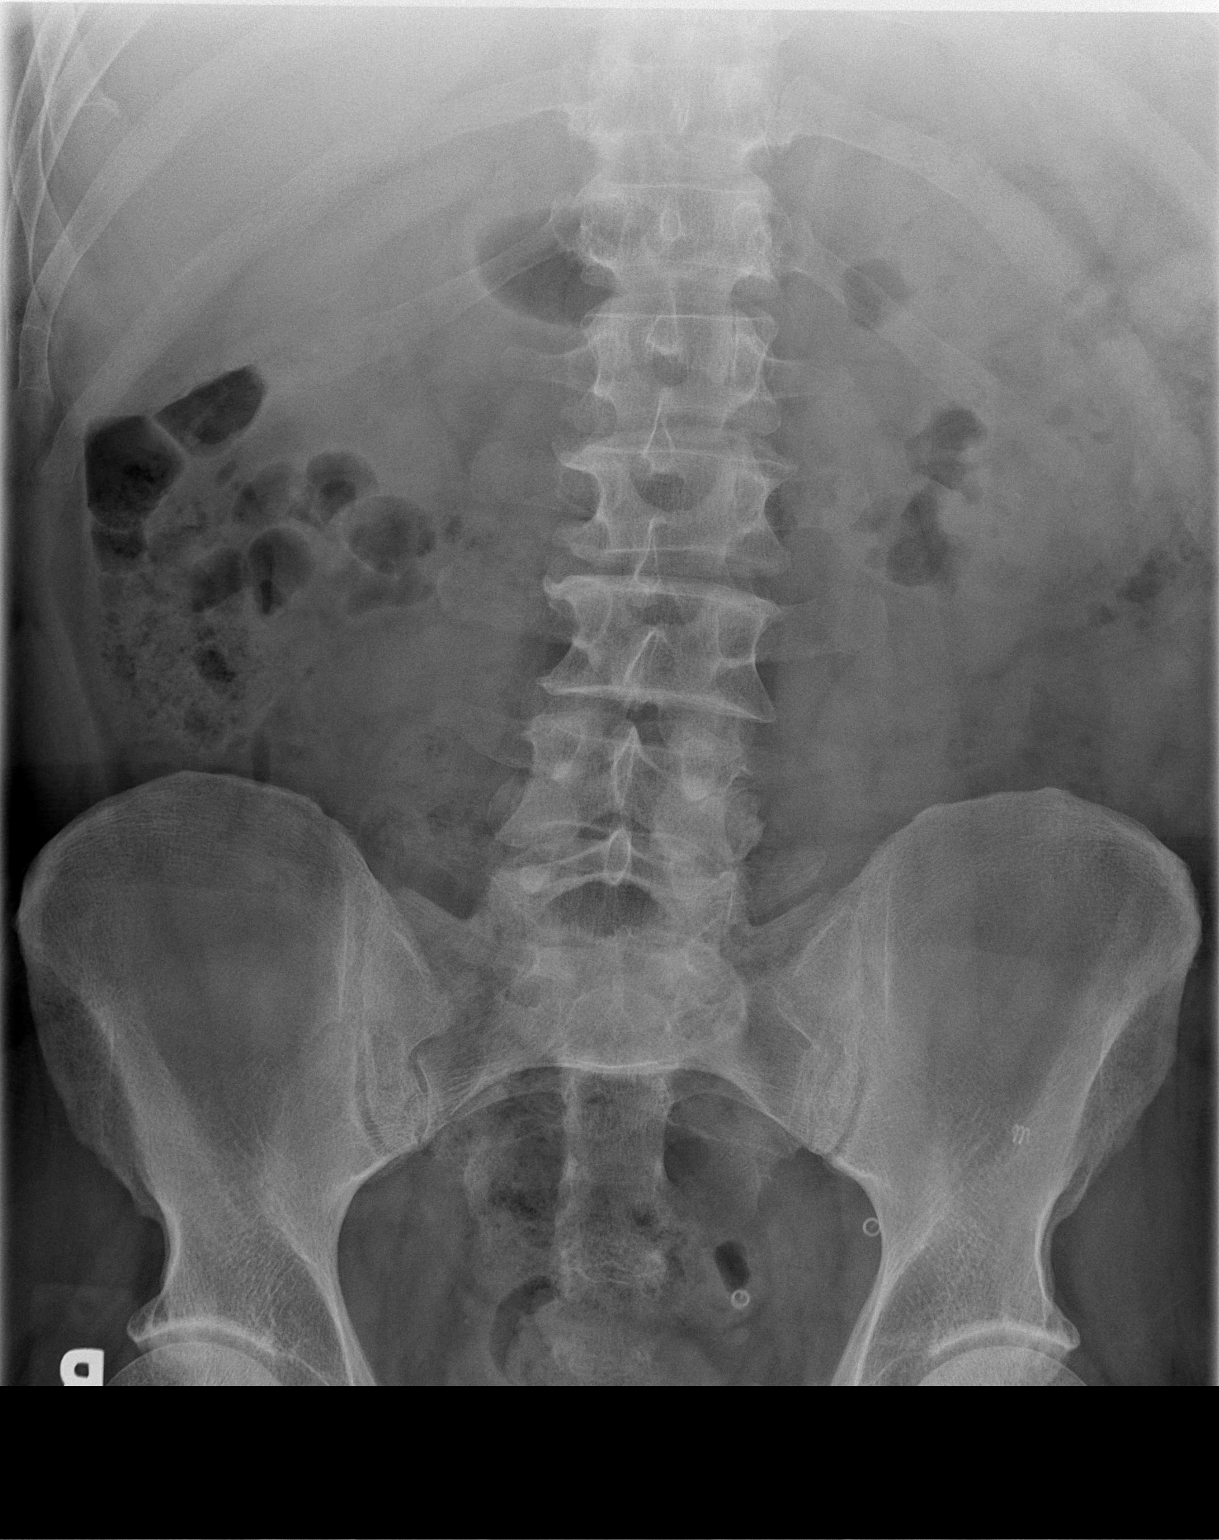
[im 2/2]
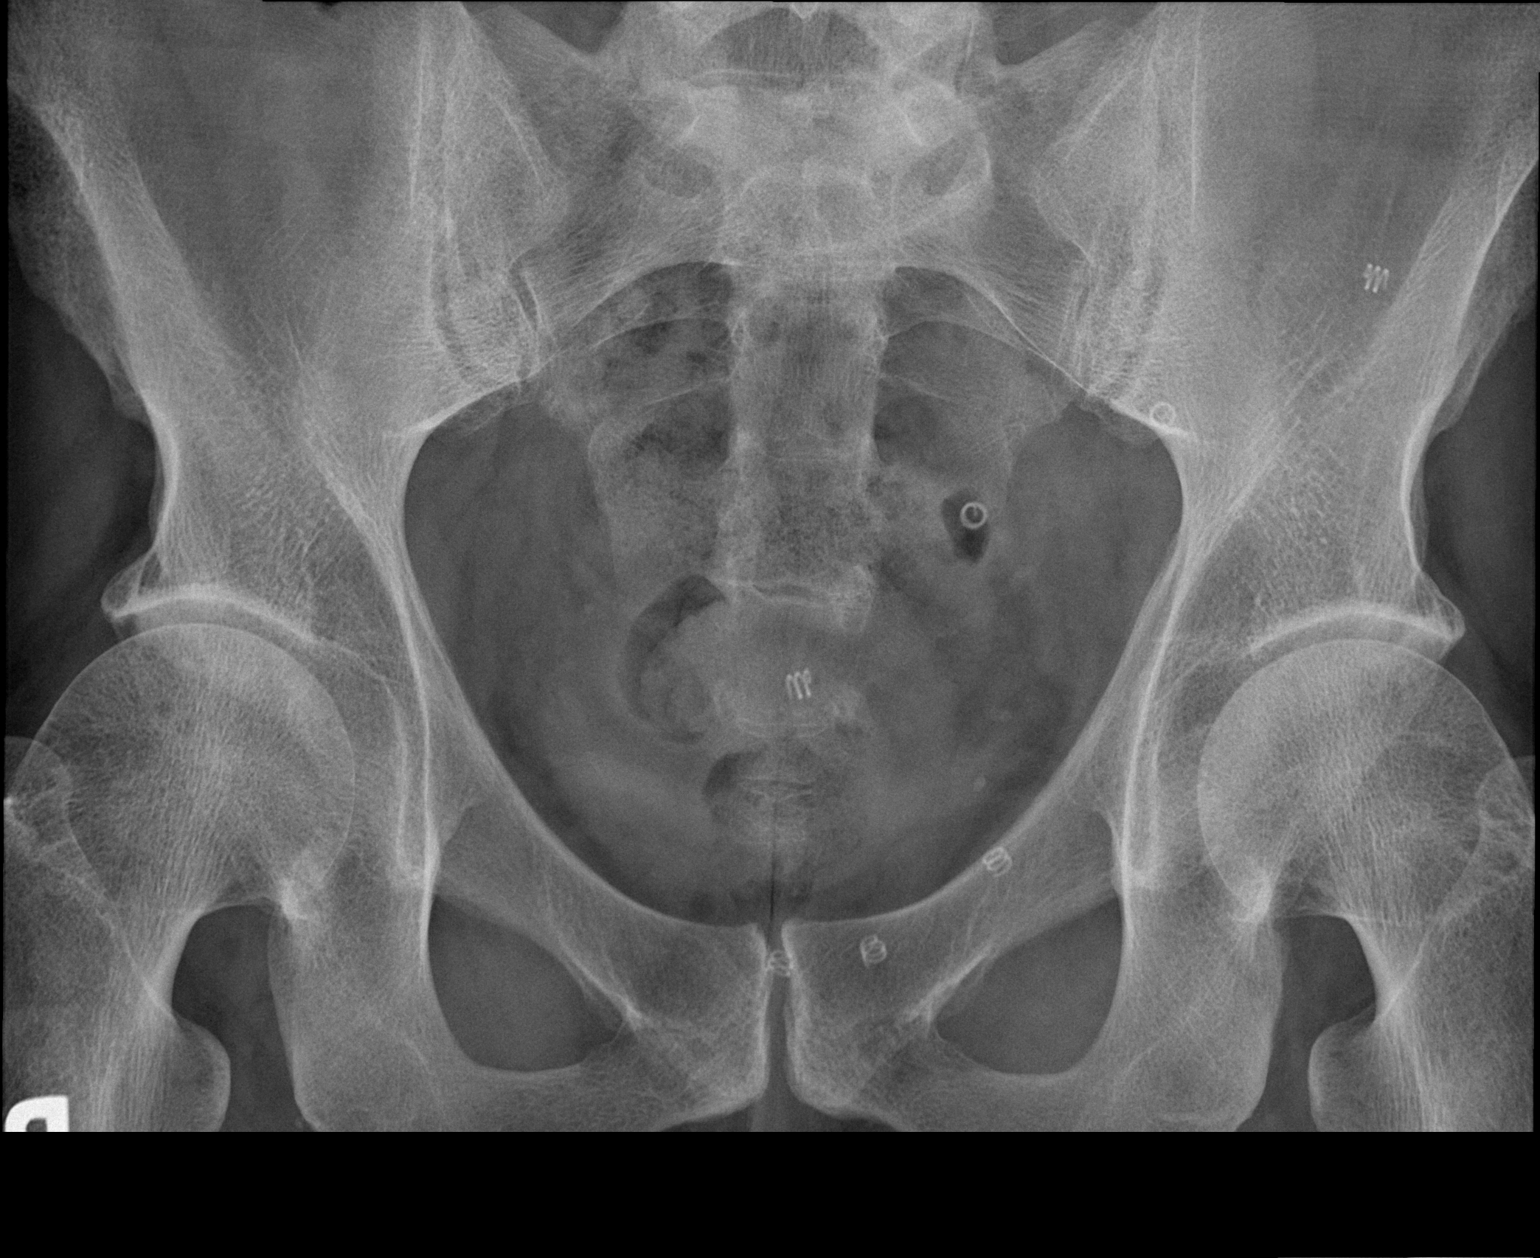

[2 of 2 positions shown; findings below may reference images not displayed]

FINDINGS: The bowel gas pattern is normal. There are tiny stones in both
kidneys. Left pelvic phleboliths are noted.
IMPRESSION: Bilateral nephrolithiasis.

## 2015-03-22 DIAGNOSIS — I10 Essential (primary) hypertension: Secondary | ICD-10-CM | POA: Insufficient documentation

## 2015-03-22 DIAGNOSIS — E782 Mixed hyperlipidemia: Secondary | ICD-10-CM | POA: Insufficient documentation

## 2015-03-22 DIAGNOSIS — K449 Diaphragmatic hernia without obstruction or gangrene: Secondary | ICD-10-CM

## 2015-03-22 DIAGNOSIS — R079 Chest pain, unspecified: Secondary | ICD-10-CM | POA: Insufficient documentation

## 2015-03-22 DIAGNOSIS — K219 Gastro-esophageal reflux disease without esophagitis: Secondary | ICD-10-CM | POA: Insufficient documentation

## 2015-03-30 NOTE — Op Note (Signed)
PATIENT NAME:  Lucas Christensen, Lucas Christensen MR#:  [id number] DATE OF BIRTH:  [birth year]  DATE OF PROCEDURE:  04/19/2013  PRINCIPAL DIAGNOSIS: Left ureterolithiasis.   POSTOPERATIVE DIAGNOSIS: Left ureterolithiasis.   PROCEDURE: Left ureteroscopy with holmium laser lithotripsy.   SURGEON: Dr. Edrick Oh.  ANESTHESIA: Laryngeal mask airway anesthesia.   INDICATION: The patient is a 64 year old gentleman with a recent episode of hematuria. He underwent evaluation demonstrating no significant bladder abnormalities. CT scan demonstrated an approximate 5 mm distal left ureteral calculus with a second smaller 3 to 4 mm stone just proximal with minimal obstruction. There has been failure of progression. He presents for ureteroscopic stone removal.   DESCRIPTION OF PROCEDURE: After informed consent was obtained, the patient was taken to the operating room and placed in the dorsal lithotomy position under laryngeal mask airway anesthesia. The patient was then prepped and draped in the usual standard fashion. The 22-French rigid cystoscope was introduced into the urethra under direct vision. A few wide bore strictures were encountered. These were gently dilated with the scope as it was passed. Upon entering the prostatic urethra, moderate bilobar hypertrophy was noted with only partial visual obstruction. Upon entering the bladder, the mucosa was inspected in its entirety with no gross mucosal lesions noted. Bilateral ureteral orifices were well visualized with no lesions noted. There was noted to be a bulge just proximal to the left ureteral orifice. A flexible tip Glidewire was introduced into the left ureteral orifice and advanced into the upper pole collecting system without difficulty. The cystoscope was removed. The 6-French rigid ureteroscope was advanced into the urinary bladder. It was easily advanced into the left ureteral orifice. Just inside of the orifice, an area of dilation was encountered with an approximately  5 to 6 mm stone. The stone was fragmented into multiple smaller pieces utilizing the holmium laser fiber. The stone was actually larger than it appeared on CT scan. Once the first stone was fragmented and the pieces basket extracted, the second stone was approximately 4 mm in size. It too was slightly larger than was appreciated on CT scan. It was fragmented into multiple smaller pieces utilizing the holmium laser fiber. The pieces were then basket extracted. Once the pieces were removed, the ureteroscope was advanced to the level of the ureteropelvic junction with no additional stones or abnormalities noted. Minimal inflammation was present. The decision was made not to place a stent. The ureteroscope was removed. The cystoscope was replaced back into the urinary bladder. The bladder was drained. The fragments were collected and will be sent for stone analysis. The cystoscope was then removed. The patient was returned to the supine position and awakened from laryngeal mask airway anesthesia. He was taken to the recovery room in stable condition. There were no problems or complications. The patient tolerated the procedure well.     ____________________________ Denice Bors. Jacqlyn Larsen, MD bsc:es D: 04/19/2013 11:03:56 ET T: 04/19/2013 11:39:24 ET JOB#: [id number]  cc: Denice Bors. Jacqlyn Larsen, MD, <Dictator> Denice Bors Nathifa Ritthaler MD ELECTRONICALLY SIGNED 05/03/2013 10:01

## 2015-10-29 ENCOUNTER — Telehealth: Payer: Self-pay | Admitting: Family Medicine

## 2015-10-29 NOTE — Telephone Encounter (Signed)
Dr Rayann Heman

## 2015-10-29 NOTE — Telephone Encounter (Signed)
Pt advised-aa 

## 2015-10-29 NOTE — Telephone Encounter (Signed)
Dr Rosanna Randy Please advise ED

## 2015-10-29 NOTE — Telephone Encounter (Signed)
Pt stated he doesn't need a referral but would like to know which GI doctor you would recommend in the [place] area. Please advise. Thanks TNP

## 2015-10-30 ENCOUNTER — Telehealth: Payer: Self-pay | Admitting: Family Medicine

## 2015-10-30 DIAGNOSIS — Z8719 Personal history of other diseases of the digestive system: Secondary | ICD-10-CM

## 2015-10-30 NOTE — Telephone Encounter (Signed)
Pt is requesting referral to see Dr Rayann Heman for history of barret's esophagus.He has been seeing a doctor in [place] but would like to switch to someone here in town

## 2015-10-30 NOTE — Telephone Encounter (Signed)
Is this ok?-aa 

## 2015-10-30 NOTE — Telephone Encounter (Signed)
Please review-aa 

## 2015-10-30 NOTE — Telephone Encounter (Signed)
ok 

## 2015-10-30 NOTE — Telephone Encounter (Signed)
Done-aa 

## 2015-11-28 ENCOUNTER — Other Ambulatory Visit: Payer: Self-pay

## 2015-11-28 DIAGNOSIS — Z0000 Encounter for general adult medical examination without abnormal findings: Secondary | ICD-10-CM

## 2015-11-28 NOTE — Progress Notes (Signed)
Patient came in to have blood drawn per Dr. Rosanna Randy at [facility].  Blood was drawn from the rigt arm without any incident. Patient wants results sent to Dr. Rosanna Randy and a copy mailed to his own address.

## 2015-11-29 LAB — CMP12+LP+TP+TSH+6AC+PSA+CBC…
ALBUMIN: 3.7 g/dL (ref 3.6–4.8)
ALT: 37 IU/L (ref 0–44)
AST: 40 IU/L (ref 0–40)
Albumin/Globulin Ratio: 1.4 (ref 1.1–2.5)
Alkaline Phosphatase: 101 IU/L (ref 39–117)
BASOS ABS: 0 10*3/uL (ref 0.0–0.2)
BASOS: 1 %
BUN / CREAT RATIO: 19 (ref 10–22)
BUN: 17 mg/dL (ref 8–27)
Bilirubin Total: 0.7 mg/dL (ref 0.0–1.2)
Calcium: 8.7 mg/dL (ref 8.6–10.2)
Chloride: 102 mmol/L (ref 96–106)
Chol/HDL Ratio: 2.6 ratio units (ref 0.0–5.0)
Cholesterol, Total: 183 mg/dL (ref 100–199)
Creatinine, Ser: 0.88 mg/dL (ref 0.76–1.27)
EOS (ABSOLUTE): 0.2 10*3/uL (ref 0.0–0.4)
EOS: 3 %
Estimated CHD Risk: <0.5 times avg. (ref 0.0–1.0)
FREE THYROXINE INDEX: 1.4 (ref 1.2–4.9)
GFR calc non Af Amer: 91 mL/min/{1.73_m2} (ref >59)
GFR, EST AFRICAN AMERICAN: 106 mL/min/{1.73_m2} (ref >59)
GGT: 25 IU/L (ref 0–65)
Globulin, Total: 2.6 g/dL (ref 1.5–4.5)
Glucose: 86 mg/dL (ref 65–99)
HDL: 70 mg/dL (ref >39)
HEMOGLOBIN: 15.9 g/dL (ref 12.6–17.7)
Hematocrit: 44.3 % (ref 37.5–51.0)
Immature Grans (Abs): 0 10*3/uL (ref 0.0–0.1)
Immature Granulocytes: 0 %
Iron: 103 ug/dL (ref 38–169)
LDH: 236 IU/L — ABNORMAL HIGH (ref 121–224)
LDL Calculated: 99 mg/dL (ref 0–99)
Lymphocytes Absolute: 2 10*3/uL (ref 0.7–3.1)
Lymphs: 36 %
MCH: 33.8 pg — AB (ref 26.6–33.0)
MCHC: 35.9 g/dL — ABNORMAL HIGH (ref 31.5–35.7)
MCV: 94 fL (ref 79–97)
MONOCYTES: 9 %
Monocytes Absolute: 0.5 10*3/uL (ref 0.1–0.9)
NEUTROS ABS: 2.9 10*3/uL (ref 1.4–7.0)
Neutrophils: 51 %
PHOSPHORUS: 2.8 mg/dL (ref 2.5–4.5)
PLATELETS: 161 10*3/uL (ref 150–379)
Potassium: 4.9 mmol/L (ref 3.5–5.2)
Prostate Specific Ag, Serum: 2.1 ng/mL (ref 0.0–4.0)
RBC: 4.7 x10E6/uL (ref 4.14–5.80)
RDW: 13.4 % (ref 12.3–15.4)
SODIUM: 141 mmol/L (ref 134–144)
T3 Uptake Ratio: 45 % — ABNORMAL HIGH (ref 24–39)
T4, Total: 3.2 ug/dL — ABNORMAL LOW (ref 4.5–12.0)
TOTAL PROTEIN: 6.3 g/dL (ref 6.0–8.5)
TRIGLYCERIDES: 69 mg/dL (ref 0–149)
TSH: 2.45 u[IU]/mL (ref 0.450–4.500)
Uric Acid: 5.6 mg/dL (ref 3.7–8.6)
VLDL Cholesterol Cal: 14 mg/dL (ref 5–40)
WBC: 5.6 10*3/uL (ref 3.4–10.8)

## 2015-11-29 LAB — PROLACTIN: PROLACTIN: 7.8 ng/mL (ref 4.0–15.2)

## 2015-11-29 LAB — TESTOSTERONE: Testosterone: 296 ng/dL — ABNORMAL LOW (ref 348–1197)

## 2015-11-29 LAB — VITAMIN D 25 HYDROXY (VIT D DEFICIENCY, FRACTURES): Vit D, 25-Hydroxy: 48 ng/mL (ref 30.0–100.0)

## 2015-11-30 ENCOUNTER — Encounter: Payer: Self-pay | Admitting: Emergency Medicine

## 2015-11-30 NOTE — Progress Notes (Signed)
Lab results were mailed to patient per his request.

## 2016-02-08 ENCOUNTER — Encounter: Payer: Self-pay | Admitting: Family Medicine

## 2016-04-08 ENCOUNTER — Encounter: Payer: Self-pay | Admitting: Family Medicine

## 2016-04-08 ENCOUNTER — Ambulatory Visit (INDEPENDENT_AMBULATORY_CARE_PROVIDER_SITE_OTHER): Payer: Managed Care, Other (non HMO) | Admitting: Family Medicine

## 2016-04-08 VITALS — BP 108/68 | HR 58 | Temp 97.9°F | Resp 14 | Ht 69.0 in | Wt 199.0 lb

## 2016-04-08 DIAGNOSIS — E785 Hyperlipidemia, unspecified: Secondary | ICD-10-CM

## 2016-04-08 DIAGNOSIS — K219 Gastro-esophageal reflux disease without esophagitis: Secondary | ICD-10-CM | POA: Insufficient documentation

## 2016-04-08 DIAGNOSIS — E291 Testicular hypofunction: Secondary | ICD-10-CM | POA: Diagnosis not present

## 2016-04-08 DIAGNOSIS — Z0000 Encounter for general adult medical examination without abnormal findings: Secondary | ICD-10-CM

## 2016-04-08 DIAGNOSIS — N400 Enlarged prostate without lower urinary tract symptoms: Secondary | ICD-10-CM | POA: Insufficient documentation

## 2016-04-08 LAB — POCT URINALYSIS DIPSTICK
BILIRUBIN UA: NEGATIVE
Blood, UA: NEGATIVE
Glucose, UA: NEGATIVE
Leukocytes, UA: NEGATIVE
Nitrite, UA: NEGATIVE
PROTEIN UA: NEGATIVE
SPEC GRAV UA: 1.02
Urobilinogen, UA: 0.2
pH, UA: 6

## 2016-04-08 NOTE — Progress Notes (Signed)
Patient ID: YONEL MALDEN, male   DOB: [birth year], 65 y.o.   MRN: [id number]       Patient: Lucas Christensen, Male    DOB: [birth year], 65 y.o.   MRN: [id number] Visit Date: 04/08/2016  Today's Provider: Wilhemena Durie, MD   Chief Complaint  Patient presents with  . Annual Exam   Subjective:    Annual physical exam Lucas Christensen is a 65 y.o. male who presents today for health maintenance and complete physical. He feels fairly well. He reports exercising about 5 days a week. He reports he is sleeping fairly well. He works for the [employer] as a Nurse, adult. ----------------------------------------------------------------- Colonoscopy- 06/01/08 internal hemorrhoids otherwise normal. Tdap- 05/14/2013 per [facility] records in Care everywhere  Immunization History  Administered Date(s) Administered  . Zoster 09/07/2013     Review of Systems  Constitutional: Negative.   HENT: Positive for tinnitus.   Eyes: Negative.   Respiratory: Negative.   Cardiovascular: Negative.   Gastrointestinal: Negative.   Endocrine: Negative.   Genitourinary: Negative.   Musculoskeletal: Negative.   Skin: Negative.   Allergic/Immunologic: Negative.   Neurological: Negative.   Hematological: Negative.   Psychiatric/Behavioral: Negative.     Social History      He  reports that he has never smoked. He has never used smokeless tobacco. He reports that he does not drink alcohol or use illicit drugs.       Social History   Social History  . Marital Status: Married    Spouse Name: N/A  . Number of Children: N/A  . Years of Education: N/A   Social History Main Topics  . Smoking status: Never Smoker   . Smokeless tobacco: Never Used  . Alcohol Use: No  . Drug Use: No  . Sexual Activity: Not Asked   Other Topics Concern  . None   Social History Narrative    Past Medical History  Diagnosis Date  . Elevated PSA   . BPH (benign prostatic hyperplasia)   . Chronic  prostatitis   . Testicular hypofunction   . Calculus of both kidneys   . Hyperlipidemia      Patient Active Problem List   Diagnosis Date Noted  . Hyperplasia, prostate 04/08/2016  . Acid reflux 04/08/2016  . Benign essential HTN 03/22/2015  . Gastroesophageal reflux disease with hiatal hernia 03/22/2015  . Combined fat and carbohydrate induced hyperlipemia 03/22/2015  . Dysphagia, unspecified(787.20) 08/29/2013  . Elevated prostate specific antigen (PSA) 08/31/2012  . 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 Hypotestosteronism 11/21/2009  . Avitaminosis D 11/21/2009  . Internal hemorrhoids without complication [id number]  . Hernia of abdominal cavity 12/14/2007  . HLD (hyperlipidemia) 06/08/2007    Past Surgical History  Procedure Laterality Date  . Shoulder surgery Left   . Hernia repair      Family History        Family Status  Relation Status Death Age  . Mother Deceased 91  . Father Alive   . Sister Alive         His family history includes Arthritis in his father; Diabetes in his father and maternal grandmother; Heart attack in his mother; Hypertension in his mother and sister.    No Known Allergies  Previous Medications   ASCORBIC ACID (VITAMIN C) 1000 MG TABLET    Take by mouth.   AZELAIC ACID (FINACEA) 15 % CREAM    Apply topically 2 (two) times daily. After skin is thoroughly washed and patted  dry, gently but thoroughly massage a thin film of azelaic acid cream into the affected area twice daily, in the morning and evening.   TAMSULOSIN (FLOMAX) 0.4 MG CAPS CAPSULE       VITAMIN E 1000 UNIT CAPSULE    Take by mouth.    Patient Care Team: Jerrol Banana., MD as PCP - General (Family Medicine)     Objective:   Vitals: There were no vitals taken for this visit.   Physical Exam  Constitutional: He is oriented to person, place, and time. He appears well-developed and well-nourished.  HENT:  Head: Normocephalic and atraumatic.  Right Ear: External  ear normal.  Left Ear: External ear normal.  Nose: Nose normal.  Mouth/Throat: Oropharynx is clear and moist.  Eyes: Conjunctivae and EOM are normal. Pupils are equal, round, and reactive to light.  Neck: Normal range of motion. Neck supple.  Cardiovascular: Normal rate, regular rhythm, normal heart sounds and intact distal pulses.   Pulmonary/Chest: Effort normal and breath sounds normal.  Abdominal: Soft. Bowel sounds are normal.  Genitourinary: Rectum normal, prostate normal and penis normal.  Musculoskeletal: Normal range of motion.  Neurological: He is alert and oriented to person, place, and time. He has normal reflexes.  Skin: Skin is warm and dry.  Psychiatric: He has a normal mood and affect. His behavior is normal. Judgment and thought content normal.     Depression Screen PHQ 2/9 Scores 04/08/2016  PHQ - 2 Score 0      Assessment & Plan:     Routine Health Maintenance and Physical Exam  Exercise Activities and Dietary recommendations Goals    None      Immunization History  Administered Date(s) Administered  . Zoster 09/07/2013    Health Maintenance  Topic Date Due  . Hepatitis C Screening  [birth year]  . HIV Screening  09/25/1967  . TETANUS/TDAP  09/25/1971  . COLONOSCOPY  09/24/2002  . INFLUENZA VACCINE  07/08/2016  . ZOSTAVAX  Completed      Discussed health benefits of physical activity, and encouraged him to engage in regular exercise appropriate for his age and condition.               BPH--Patient stopped Flomax 2 months ago and has noticed no difference in his voiding             History of Barrett's esophagus--last EGD was normal in December 2016  --------------------------------------------------------------------

## 2016-05-16 ENCOUNTER — Ambulatory Visit: Payer: Self-pay

## 2016-05-16 VITALS — BP 119/70 | HR 53 | Temp 97.9°F

## 2016-05-16 DIAGNOSIS — Z299 Encounter for prophylactic measures, unspecified: Secondary | ICD-10-CM

## 2016-05-19 LAB — CMP12+LP+TP+TSH+6AC+PSA+CBC…
ALBUMIN: 3.7 g/dL (ref 3.6–4.8)
ALT: 98 IU/L — ABNORMAL HIGH (ref 0–44)
AST: 96 IU/L — AB (ref 0–40)
Albumin/Globulin Ratio: 1.5 (ref 1.2–2.2)
Alkaline Phosphatase: 152 IU/L — ABNORMAL HIGH (ref 39–117)
BUN / CREAT RATIO: 21 (ref 10–24)
BUN: 19 mg/dL (ref 8–27)
Basophils Absolute: 0 10*3/uL (ref 0.0–0.2)
Basos: 1 %
Bilirubin Total: 0.6 mg/dL (ref 0.0–1.2)
CHLORIDE: 102 mmol/L (ref 96–106)
CREATININE: 0.89 mg/dL (ref 0.76–1.27)
Calcium: 8.7 mg/dL (ref 8.6–10.2)
Chol/HDL Ratio: 2.4 ratio units (ref 0.0–5.0)
Cholesterol, Total: 132 mg/dL (ref 100–199)
EOS (ABSOLUTE): 0.1 10*3/uL (ref 0.0–0.4)
Eos: 2 %
Free Thyroxine Index: 1 — ABNORMAL LOW (ref 1.2–4.9)
GFR, EST AFRICAN AMERICAN: 105 mL/min/{1.73_m2} (ref >59)
GFR, EST NON AFRICAN AMERICAN: 91 mL/min/{1.73_m2} (ref >59)
GGT: 87 IU/L — AB (ref 0–65)
GLUCOSE: 109 mg/dL — AB (ref 65–99)
Globulin, Total: 2.4 g/dL (ref 1.5–4.5)
HDL: 54 mg/dL (ref >39)
Hematocrit: 44.7 % (ref 37.5–51.0)
Hemoglobin: 15.6 g/dL (ref 12.6–17.7)
IRON: 75 ug/dL (ref 38–169)
Immature Grans (Abs): 0 10*3/uL (ref 0.0–0.1)
Immature Granulocytes: 0 %
LDH: 236 IU/L — ABNORMAL HIGH (ref 121–224)
LDL Calculated: 66 mg/dL (ref 0–99)
LYMPHS ABS: 1.9 10*3/uL (ref 0.7–3.1)
Lymphs: 47 %
MCH: 33.5 pg — ABNORMAL HIGH (ref 26.6–33.0)
MCHC: 34.9 g/dL (ref 31.5–35.7)
MCV: 96 fL (ref 79–97)
MONOCYTES: 9 %
Monocytes Absolute: 0.4 10*3/uL (ref 0.1–0.9)
Neutrophils Absolute: 1.7 10*3/uL (ref 1.4–7.0)
Neutrophils: 41 %
PHOSPHORUS: 2.6 mg/dL (ref 2.5–4.5)
POTASSIUM: 4.3 mmol/L (ref 3.5–5.2)
Platelets: 139 10*3/uL — ABNORMAL LOW (ref 150–379)
Prostate Specific Ag, Serum: 1.7 ng/mL (ref 0.0–4.0)
RBC: 4.66 x10E6/uL (ref 4.14–5.80)
RDW: 13.8 % (ref 12.3–15.4)
Sodium: 140 mmol/L (ref 134–144)
T3 UPTAKE RATIO: 49 % — AB (ref 24–39)
T4, Total: 2 ug/dL — ABNORMAL LOW (ref 4.5–12.0)
TOTAL PROTEIN: 6.1 g/dL (ref 6.0–8.5)
TSH: 2.23 u[IU]/mL (ref 0.450–4.500)
Triglycerides: 61 mg/dL (ref 0–149)
Uric Acid: 4.5 mg/dL (ref 3.7–8.6)
VLDL Cholesterol Cal: 12 mg/dL (ref 5–40)
WBC: 4 10*3/uL (ref 3.4–10.8)

## 2016-05-19 LAB — TESTOSTERONE: TESTOSTERONE: 176 ng/dL — AB (ref 348–1197)

## 2016-05-19 LAB — B. BURGDORFI ANTIBODIES: Lyme IgG/IgM Ab: <0.91 {ISR} (ref 0.00–0.90)

## 2016-05-19 LAB — VITAMIN D 25 HYDROXY (VIT D DEFICIENCY, FRACTURES): Vit D, 25-Hydroxy: 56.2 ng/mL (ref 30.0–100.0)

## 2017-01-02 ENCOUNTER — Ambulatory Visit: Payer: Self-pay | Admitting: Physician Assistant

## 2017-01-02 ENCOUNTER — Encounter: Payer: Self-pay | Admitting: Physician Assistant

## 2017-01-02 VITALS — BP 122/80 | HR 76 | Temp 98.0°F

## 2017-01-02 DIAGNOSIS — B349 Viral infection, unspecified: Secondary | ICD-10-CM

## 2017-01-02 LAB — POCT INFLUENZA A/B
INFLUENZA A, POC: NEGATIVE
Influenza B, POC: NEGATIVE

## 2017-01-02 NOTE — Progress Notes (Signed)
S: C/o runny nose and congestion with scratchy throat, + fever, chills last night, denies cp/sob, v/d; had 1 episode of diarrhea, granddaughter is sick, states his fever broke last night Using otc meds: robitussin  O: PE: vitals wnl, nad,  perrl eomi, normocephalic, tms dull, nasal mucosa red and swollen, throat injected, neck supple no lymph, lungs c t a, cv rrr, neuro intact, flu swab neg  A:  Acute flu like illness   P: drink fluids, continue regular meds , use otc meds of choice, return if not improving in 5 days, return earlier if worsening , reassurance, if worsening with cough and congestion have zpack filled, if diarrhea do not use zpack, use otc immodium

## 2017-01-15 ENCOUNTER — Encounter: Payer: Self-pay | Admitting: Family Medicine

## 2017-01-16 ENCOUNTER — Encounter: Payer: Self-pay | Admitting: Family Medicine

## 2017-01-22 ENCOUNTER — Encounter: Payer: Self-pay | Admitting: Family Medicine

## 2017-02-12 ENCOUNTER — Ambulatory Visit: Payer: Self-pay | Admitting: Physician Assistant

## 2017-02-12 ENCOUNTER — Encounter: Payer: Self-pay | Admitting: Physician Assistant

## 2017-02-12 VITALS — BP 100/65 | HR 86 | Temp 99.5°F

## 2017-02-12 DIAGNOSIS — R319 Hematuria, unspecified: Principal | ICD-10-CM

## 2017-02-12 DIAGNOSIS — N390 Urinary tract infection, site not specified: Secondary | ICD-10-CM

## 2017-02-12 LAB — POCT URINALYSIS DIPSTICK
Nitrite, UA: POSITIVE
PH UA: 5
SPEC GRAV UA: 1.01
Urobilinogen, UA: >=8

## 2017-02-12 MED ORDER — LEVOFLOXACIN 750 MG PO TABS: 750.0000 mg | ORAL_TABLET | Freq: Every day | ORAL | 0 refills | Status: DC

## 2017-02-12 NOTE — Progress Notes (Signed)
S:  C/o uti sx for 2 days, burning, urgency, frequency, denies vaginal discharge, abdominal pain or flank pain:  Remainder ros neg  O:  Vitals wnl, nad, no cva tenderness, back nontender, lungs c t a,cv rrr, abd soft nontender, bs normal, n/v intact, ua 3+ leuks, +nitrites, 1+ blood  A: uti  P: levaquin 750 mg x 3 weeks, increase water intake, add cranberry juice, return if not improving in 2 -3 days, return earlier if worsening, discussed pyelonephritis sx,

## 2017-02-17 LAB — URINE CULTURE

## 2017-04-09 ENCOUNTER — Encounter: Payer: BC Managed Care – PPO | Admitting: Family Medicine

## 2017-05-08 ENCOUNTER — Encounter: Payer: Self-pay | Admitting: Physician Assistant

## 2017-05-08 ENCOUNTER — Ambulatory Visit: Payer: Self-pay | Admitting: Physician Assistant

## 2017-05-08 VITALS — BP 130/80 | HR 54 | Temp 98.0°F | Ht 70.0 in | Wt 200.0 lb

## 2017-05-08 DIAGNOSIS — Z0189 Encounter for other specified special examinations: Secondary | ICD-10-CM

## 2017-05-08 DIAGNOSIS — Z008 Encounter for other general examination: Secondary | ICD-10-CM

## 2017-05-08 DIAGNOSIS — Z0000 Encounter for general adult medical examination without abnormal findings: Secondary | ICD-10-CM

## 2017-05-08 NOTE — Progress Notes (Signed)
S: pt here for wellness physical and biometrics for insurance purposes, no complaints , recently had a prostate procedure to "open up" the prostate; will need a psa in mid June for urology so will come back for  that test; ros neg. PMH: as noted on chart   Social: nonsmoker, no etoh  Fam: mother died of an MI at 61 but was a heavy smoker, father died at 7 of old age, sister is healthy age 65  O: vitals wnl, nad, ENT wnl, neck supple no lymph, lungs c t a, cv rrr, abd soft nontender bs normal all 4 quads  A: wellness, biometric physical  P: f/u in mid June for psa level, return prn

## 2017-05-09 LAB — CMP12+LP+TP+TSH+6AC+CBC/D/PLT
ALT: 26 IU/L (ref 0–44)
AST: 29 IU/L (ref 0–40)
Albumin/Globulin Ratio: 1.8 (ref 1.2–2.2)
Albumin: 4.2 g/dL (ref 3.6–4.8)
Alkaline Phosphatase: 93 IU/L (ref 39–117)
BASOS: 1 %
BILIRUBIN TOTAL: 0.8 mg/dL (ref 0.0–1.2)
BUN/Creatinine Ratio: 11 (ref 10–24)
BUN: 11 mg/dL (ref 8–27)
Basophils Absolute: 0 10*3/uL (ref 0.0–0.2)
CHLORIDE: 101 mmol/L (ref 96–106)
CHOL/HDL RATIO: 4.1 ratio (ref 0.0–5.0)
CREATININE: 1.02 mg/dL (ref 0.76–1.27)
Calcium: 9 mg/dL (ref 8.6–10.2)
Cholesterol, Total: 223 mg/dL — ABNORMAL HIGH (ref 100–199)
EOS (ABSOLUTE): 0.1 10*3/uL (ref 0.0–0.4)
EOS: 2 %
ESTIMATED CHD RISK: 0.8 times avg. (ref 0.0–1.0)
Free Thyroxine Index: 1.4 (ref 1.2–4.9)
GFR calc Af Amer: 89 mL/min/{1.73_m2} (ref >59)
GFR, EST NON AFRICAN AMERICAN: 77 mL/min/{1.73_m2} (ref >59)
GGT: 16 IU/L (ref 0–65)
GLUCOSE: 83 mg/dL (ref 65–99)
Globulin, Total: 2.4 g/dL (ref 1.5–4.5)
HDL: 54 mg/dL (ref >39)
HEMATOCRIT: 45.4 % (ref 37.5–51.0)
HEMOGLOBIN: 15.4 g/dL (ref 13.0–17.7)
Immature Grans (Abs): 0 10*3/uL (ref 0.0–0.1)
Immature Granulocytes: 0 %
Iron: 99 ug/dL (ref 38–169)
LDH: 231 IU/L — ABNORMAL HIGH (ref 121–224)
LDL Calculated: 148 mg/dL — ABNORMAL HIGH (ref 0–99)
LYMPHS ABS: 2 10*3/uL (ref 0.7–3.1)
Lymphs: 35 %
MCH: 32.7 pg (ref 26.6–33.0)
MCHC: 33.9 g/dL (ref 31.5–35.7)
MCV: 96 fL (ref 79–97)
MONOCYTES: 6 %
Monocytes Absolute: 0.3 10*3/uL (ref 0.1–0.9)
NEUTROS ABS: 3.3 10*3/uL (ref 1.4–7.0)
Neutrophils: 56 %
PHOSPHORUS: 2.7 mg/dL (ref 2.5–4.5)
PLATELETS: 170 10*3/uL (ref 150–379)
POTASSIUM: 5.2 mmol/L (ref 3.5–5.2)
RBC: 4.71 x10E6/uL (ref 4.14–5.80)
RDW: 14.2 % (ref 12.3–15.4)
SODIUM: 139 mmol/L (ref 134–144)
T3 UPTAKE RATIO: 29 % (ref 24–39)
T4, Total: 4.7 ug/dL (ref 4.5–12.0)
TSH: 2.08 u[IU]/mL (ref 0.450–4.500)
Total Protein: 6.6 g/dL (ref 6.0–8.5)
Triglycerides: 106 mg/dL (ref 0–149)
URIC ACID: 4.7 mg/dL (ref 3.7–8.6)
VLDL CHOLESTEROL CAL: 21 mg/dL (ref 5–40)
WBC: 5.8 10*3/uL (ref 3.4–10.8)

## 2017-05-09 LAB — HEPATITIS C ANTIBODY (REFLEX)

## 2017-05-09 LAB — VITAMIN D 25 HYDROXY (VIT D DEFICIENCY, FRACTURES): VIT D 25 HYDROXY: 37.8 ng/mL (ref 30.0–100.0)

## 2017-05-09 LAB — HCV COMMENT:

## 2017-05-09 LAB — HIV ANTIBODY (ROUTINE TESTING W REFLEX): HIV Screen 4th Generation wRfx: NONREACTIVE

## 2017-05-12 ENCOUNTER — Telehealth: Payer: Self-pay | Admitting: Family Medicine

## 2017-05-12 DIAGNOSIS — W57XXXA Bitten or stung by nonvenomous insect and other nonvenomous arthropods, initial encounter: Secondary | ICD-10-CM

## 2017-05-12 MED ORDER — DOXYCYCLINE HYCLATE 100 MG PO TABS: 200.0000 mg | ORAL_TABLET | Freq: Two times a day (BID) | ORAL | 0 refills | Status: DC

## 2017-05-12 NOTE — Telephone Encounter (Signed)
Please Review.  Thanks,  -Joseline 

## 2017-05-12 NOTE — Telephone Encounter (Signed)
Pt advised-aa 

## 2017-05-12 NOTE — Telephone Encounter (Signed)
So guideline now is to take 200mg  doxycycline once and monitor for 30 days for signs and symptoms of lyme (rash, mylagias, artralgias) and/or RMSF (uncontrolled fever and rash), or other tick borne illness. I have sent this in to [facility]. If he develops any other symptom he will need to be evaluated for tick borne illness.

## 2017-05-12 NOTE — Telephone Encounter (Signed)
Pt's wife Marcie Bal called requesting an Rx for  Tetracycline be sent to [facility] [address]. Marcie Bal stated that she and her husband both have multiple tick bites and Dr. Rosanna Randy has sent this in the past for them. I advised Dr. Rosanna Randy is out of the office this week and pt request that another provider in the office send this in. I didn't see this medication in [facility] or the old system and Marcie Bal stated she didn't remember the last time this was sent in. I tried to get Marcie Bal to schedule an OV but she stated pt was on duty and she is keeping her grandchildren. Please advise. Thanks TNP

## 2017-05-21 ENCOUNTER — Encounter: Payer: Self-pay | Admitting: Family Medicine

## 2017-05-26 ENCOUNTER — Other Ambulatory Visit: Payer: Self-pay

## 2017-05-26 DIAGNOSIS — Z299 Encounter for prophylactic measures, unspecified: Secondary | ICD-10-CM

## 2017-05-26 NOTE — Progress Notes (Signed)
Patient came in to have blood drawn for testing per Susan's authorization. 

## 2017-05-27 LAB — TESTOSTERONE: TESTOSTERONE: 414 ng/dL (ref 264–916)

## 2017-05-27 LAB — PSA: Prostate Specific Ag, Serum: 4.6 ng/mL — ABNORMAL HIGH (ref 0.0–4.0)

## 2017-06-03 ENCOUNTER — Encounter: Payer: BC Managed Care – PPO | Admitting: Family Medicine

## 2017-12-29 ENCOUNTER — Encounter: Payer: BC Managed Care – PPO | Admitting: Family Medicine

## 2018-04-14 ENCOUNTER — Encounter: Payer: Self-pay | Admitting: Physician Assistant

## 2018-04-14 ENCOUNTER — Ambulatory Visit: Payer: Medicare Other | Admitting: Physician Assistant

## 2018-04-14 VITALS — BP 116/70 | HR 76 | Temp 99.3°F | Resp 16 | Wt 206.0 lb

## 2018-04-14 DIAGNOSIS — W57XXXS Bitten or stung by nonvenomous insect and other nonvenomous arthropods, sequela: Secondary | ICD-10-CM

## 2018-04-14 DIAGNOSIS — M791 Myalgia, unspecified site: Secondary | ICD-10-CM

## 2018-04-14 DIAGNOSIS — R509 Fever, unspecified: Secondary | ICD-10-CM

## 2018-04-14 MED ORDER — DOXYCYCLINE HYCLATE 100 MG PO TABS: 100.0000 mg | ORAL_TABLET | Freq: Two times a day (BID) | ORAL | 0 refills | Status: AC

## 2018-04-14 NOTE — Progress Notes (Signed)
Patient: Lucas Christensen Male    DOB: Mar 18, 1951   67 y.o.   MRN: [id number] Visit Date: 04/14/2018  Today's Provider: Trinna Post, PA-C   Chief Complaint  Patient presents with  . Tick Removal    Has had several tick bites in the last few weeks.   Pt is concerned for tick fever.  He reports having it in the past.    Subjective:    HPI  Lucas Christensen is a 67 y/o man who presents with flu like symptoms and tick bites over the past several weeks. Says he felt chilly, had muscle aches. Den[place]. Says he had "tick fever" previously. He has never had serology, but had been treated with antibiotics.     No Known Allergies   Current Outpatient Medications:  .  Ascorbic Acid (VITAMIN C) 1000 MG tablet, Take by mouth., Disp: , Rfl:  .  niacin 500 MG CR capsule, Take by mouth., Disp: , Rfl:  .  vitamin E 1000 UNIT capsule, Take by mouth., Disp: , Rfl:  .  Azelaic Acid (FINACEA) 15 % cream, Apply topically 2 (two) times daily. After skin is thoroughly washed and patted dry, gently but thoroughly massage a thin film of azelaic acid cream into the affected area twice daily, in the morning and evening., Disp: , Rfl:  .  doxycycline (VIBRA-TABS) 100 MG tablet, Take 1 tablet (100 mg total) by mouth 2 (two) times daily for 10 days., Disp: 20 tablet, Rfl: 0 .  levofloxacin (LEVAQUIN) 750 MG tablet, Take 1 tablet (750 mg total) by mouth daily., Disp: 21 tablet, Rfl: 0  Review of Systems  Constitutional: Positive for chills and fatigue. Negative for activity change, appetite change, diaphoresis, fever and unexpected weight change.  HENT: Negative.   Eyes: Negative.   Respiratory: Negative.   Gastrointestinal: Positive for diarrhea. Negative for abdominal distention, abdominal pain, anal bleeding, blood in stool, constipation, nausea, rectal pain and vomiting.  Musculoskeletal: Positive for arthralgias and myalgias. Negative for  back pain, gait problem, joint swelling, neck pain and neck stiffness.  Neurological: Negative for dizziness, light-headedness and headaches.    Social History   Tobacco Use  . Smoking status: Never Smoker  . Smokeless tobacco: Never Used  Substance Use Topics  . Alcohol use: No   Objective:   BP 116/70 (BP Location: Right Arm, Patient Position: Sitting, Cuff Size: Normal)   Pulse 76   Temp 99.3 F (37.4 C) (Oral)   Resp 16   Wt 206 lb (93.4 kg)   BMI 29.56 kg/m  Vitals:   04/14/18 1211  BP: 116/70  Pulse: 76  Resp: 16  Temp: 99.3 F (37.4 C)  TempSrc: Oral  Weight: 206 lb (93.4 kg)     Physical Exam      Assessment & Plan:     1. Fever, unspecified fever cause  Unsure if he ever had tick borne illness due to lack of serology - it's possible he has, which would give immunity. But will treat empirically as if he has not. Labs and tx as below.  - Rocky mtn spotted fvr abs pnl(IgG+IgM) - Lyme Ab/Western Blot Reflex - doxycycline (VIBRA-TABS) 100 MG tablet; Take 1 tablet (100 mg total) by mouth 2 (two) times daily for 10 days.  Dispense: 20 tablet; Refill: 0  2. Muscle ache  - Rocky mtn spotted fvr abs pnl(IgG+IgM) - Lyme Ab/Western  Blot Reflex - doxycycline (VIBRA-TABS) 100 MG tablet; Take 1 tablet (100 mg total) by mouth 2 (two) times daily for 10 days.  Dispense: 20 tablet; Refill: 0  3. Tick bite, sequela  - Rocky mtn spotted fvr abs pnl(IgG+IgM) - Lyme Ab/Western Blot Reflex - doxycycline (VIBRA-TABS) 100 MG tablet; Take 1 tablet (100 mg total) by mouth 2 (two) times daily for 10 days.  Dispense: 20 tablet; Refill: 0  Return if symptoms worsen or fail to improve.  The entirety of the information documented in the History of Present Illness, Review of Systems and Physical Exam were personally obtained by me. Portions of this information were initially documented by Ashley Royalty, CMA and reviewed by me for thoroughness and accuracy.        Trinna Post, PA-C  [facility]

## 2018-04-14 NOTE — Patient Instructions (Signed)

## 2018-04-16 LAB — LYME AB/WESTERN BLOT REFLEX
LYME DISEASE AB, QUANT, IGM: <0.8 index (ref 0.00–0.79)
Lyme IgG/IgM Ab: <0.91 {ISR} (ref 0.00–0.90)

## 2018-04-16 LAB — ROCKY MTN SPOTTED FVR ABS PNL(IGG+IGM)
RMSF IgG: POSITIVE — AB
RMSF IgM: 0.31 index (ref 0.00–0.89)

## 2018-04-16 LAB — RMSF, IGG, IFA: RMSF, IGG, IFA: 1:128 {titer} — ABNORMAL HIGH

## 2018-04-19 ENCOUNTER — Telehealth: Payer: Self-pay

## 2018-04-19 NOTE — Telephone Encounter (Signed)
Pt advised.   Thanks,   -Laura  

## 2018-04-19 NOTE — Telephone Encounter (Signed)
-----   Message from Trinna Post, [place] sent at 04/16/2018  5:01 PM EDT ----- Lab results negative for lyme. They do look positive for recent infection with [facility] Spotted Fever. Please continue doxycycline.

## 2018-08-18 ENCOUNTER — Encounter: Payer: Self-pay | Admitting: Family Medicine

## 2018-08-18 ENCOUNTER — Ambulatory Visit (INDEPENDENT_AMBULATORY_CARE_PROVIDER_SITE_OTHER): Payer: Medicare Other | Admitting: Family Medicine

## 2018-08-18 VITALS — BP 118/70 | HR 55 | Temp 98.0°F | Resp 16 | Ht 70.0 in | Wt 211.0 lb

## 2018-08-18 DIAGNOSIS — N400 Enlarged prostate without lower urinary tract symptoms: Secondary | ICD-10-CM

## 2018-08-18 DIAGNOSIS — Z0000 Encounter for general adult medical examination without abnormal findings: Secondary | ICD-10-CM | POA: Diagnosis not present

## 2018-08-18 DIAGNOSIS — E782 Mixed hyperlipidemia: Secondary | ICD-10-CM

## 2018-08-18 DIAGNOSIS — Z23 Encounter for immunization: Secondary | ICD-10-CM | POA: Diagnosis not present

## 2018-08-18 DIAGNOSIS — Z1211 Encounter for screening for malignant neoplasm of colon: Secondary | ICD-10-CM | POA: Diagnosis not present

## 2018-08-18 DIAGNOSIS — I10 Essential (primary) hypertension: Secondary | ICD-10-CM | POA: Diagnosis not present

## 2018-08-18 NOTE — Progress Notes (Signed)
Patient: Lucas Christensen, Male    DOB: Oct 24, 1951, 67 y.o.   MRN: [id number] Visit Date: 08/18/2018  Today's Provider: Wilhemena Durie, MD   Chief Complaint  Patient presents with  . Annual Exam   Subjective:  Lucas Christensen is a 67 y.o. male who presents today for health maintenance and complete physical. He feels well. He reports exercising daily. He reports he is sleeping well.   Review of Systems  Constitutional: Negative.   HENT: Positive for hearing loss and tinnitus.   Eyes: Negative.   Respiratory: Negative.   Cardiovascular: Negative.   Gastrointestinal: Negative.   Endocrine: Negative.   Genitourinary: Negative.   Musculoskeletal: Negative.   Skin: Negative.   Allergic/Immunologic: Negative.   Neurological: Negative.   Hematological: Negative.   Psychiatric/Behavioral: Negative.     Social History   Socioeconomic History  . Marital status: Married    Spouse name: Not on file  . Number of children: Not on file  . Years of education: Not on file  . Highest education level: Not on file  Occupational History  . Not on file  Social Needs  . Financial resource strain: Not on file  . Food insecurity:    Worry: Not on file    Inability: Not on file  . Transportation needs:    Medical: Not on file    Non-medical: Not on file  Tobacco Use  . Smoking status: Never Smoker  . Smokeless tobacco: Never Used  Substance and Sexual Activity  . Alcohol use: No  . Drug use: No  . Sexual activity: Not on file  Lifestyle  . Physical activity:    Days per week: Not on file    Minutes per session: Not on file  . Stress: Not on file  Relationships  . Social connections:    Talks on phone: Not on file    Gets together: Not on file    Attends religious service: Not on file    Active member of club or organization: Not on file    Attends meetings of clubs or organizations: Not on file    Relationship status: Not on file  . Intimate partner violence:    Fear of current  or ex partner: Not on file    Emotionally abused: Not on file    Physically abused: Not on file    Forced sexual activity: Not on file  Other Topics Concern  . Not on file  Social History Narrative  . Not on file    Patient Active Problem List   Diagnosis Date Noted  . Hyperplasia, prostate 04/08/2016  . Acid reflux 04/08/2016  . Hypogonadism male 04/08/2016  . Benign essential HTN 03/22/2015  . Gastroesophageal reflux disease with hiatal hernia 03/22/2015  . Combined fat and carbohydrate induced hyperlipemia 03/22/2015  . Dysphagia, unspecified(787.20) 08/29/2013  . Elevated prostate specific antigen (PSA) 08/31/2012  . 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 Hypotestosteronism 11/21/2009  . Avitaminosis D 11/21/2009  . Internal hemorrhoids without complication 00/51/1021  . Hernia of abdominal cavity 12/14/2007  . HLD (hyperlipidemia) 06/08/2007    Past Surgical History:  Procedure Laterality Date  . HERNIA REPAIR    . PROSTATE SURGERY    . SHOULDER SURGERY Left     His family history includes Arthritis in his father; Diabetes in his father and maternal grandmother; Heart attack in his mother; Hypertension in his mother and sister.     Outpatient Encounter Medications as of 08/18/2018  Medication Sig Note  .  Ascorbic Acid (VITAMIN C) 1000 MG tablet Take by mouth. 01/02/2016: Received from: [facility]  . cholecalciferol (VITAMIN D) 1000 units tablet Take 1,000 Units by mouth daily.   . niacin 500 MG CR capsule Take by mouth. 04/08/2016: Received from: [facility]  . vitamin E 1000 UNIT capsule Take by mouth. 01/02/2016: Received from: [facility]  . [DISCONTINUED] Azelaic Acid (FINACEA) 15 % cream Apply topically 2 (two) times daily. After skin is thoroughly washed and patted dry, gently but thoroughly massage a thin film of azelaic acid cream into the affected area twice daily, in the morning and evening.   . [DISCONTINUED]  levofloxacin (LEVAQUIN) 750 MG tablet Take 1 tablet (750 mg total) by mouth daily.    No facility-administered encounter medications on file as of 08/18/2018.     Patient Care Team: Jerrol Banana., MD as PCP - General (Family Medicine)      Objective:   Vitals:  Vitals:   08/18/18 0922  BP: 118/70  Pulse: (!) 55  Resp: 16  Temp: 98 F (36.7 C)  TempSrc: Oral  Weight: 211 lb (95.7 kg)  Height: 5\' 10"  (1.778 m)    Physical Exam  Constitutional: He is oriented to person, place, and time. He appears well-developed and well-nourished.  HENT:  Head: Normocephalic and atraumatic.  Right Ear: External ear normal.  Left Ear: External ear normal.  Nose: Nose normal.  Mouth/Throat: Oropharynx is clear and moist.  Eyes: Pupils are equal, round, and reactive to light. Conjunctivae and EOM are normal.  Neck: Normal range of motion. Neck supple.  Cardiovascular: Normal rate, regular rhythm, normal heart sounds and intact distal pulses.  Pulmonary/Chest: Effort normal and breath sounds normal.  Abdominal: Soft. Bowel sounds are normal.  Genitourinary: Rectum normal, prostate normal and penis normal.  Musculoskeletal: Normal range of motion.  Neurological: He is alert and oriented to person, place, and time.  Skin: Skin is warm and dry.  Psychiatric: He has a normal mood and affect. His behavior is normal. Judgment and thought content normal.   Functional Status Survey: Is the patient deaf or have difficulty hearing?: Yes Does the patient have difficulty seeing, even when wearing glasses/contacts?: No Does the patient have difficulty concentrating, remembering, or making decisions?: No Does the patient have difficulty walking or climbing stairs?: No Does the patient have difficulty dressing or bathing?: No Does the patient have difficulty doing errands alone such as visiting a doctor's office or shopping?: No    Office Visit from 08/18/2018 in [place]   AUDIT-C Score  1     Current Exercise Habits: Home exercise routine, Frequency (Times/Week): 7    Depression Screen PHQ 2/9 Scores 08/18/2018 04/08/2016  PHQ - 2 Score 0 0   Cognitive Testing - 6-CIT  Correct? Score   What year is it? yes 0 0 or 4  What month is it? yes 0 0 or 3  Memorize:    [place], [place],  42,  [place],      What time is it? (within 1 hour) yes 0 0 or 3  Count backwards from 20 yes 0 0, 2, or 4  Name the months of the year yes 0 0, 2, or 4  Repeat name & address above yes 0 0, 2, 4, 6, 8, or 10       TOTAL SCORE  0/28   Interpretation:  Normal  Normal (0-7) Abnormal (8-28)  Assessment & Plan:     Routine Health Maintenance and Physical Exam  Exercise Activities and Dietary recommendations Goals   None     Immunization History  Administered Date(s) Administered  . Tdap 05/14/2013  . Zoster 09/07/2013    Health Maintenance  Topic Date Due  . COLONOSCOPY  09/24/2002  . PNA vac Low Risk Adult (1 of 2 - PCV13) 09/24/2017  . INFLUENZA VACCINE  07/08/2018  . TETANUS/TDAP  05/15/2023  . Hepatitis C Screening  Completed  . HIV Screening  Completed     Discussed health benefits of physical activity, and encouraged him to engage in regular exercise appropriate for his age and condition.  1. Medicare welcome exam  - EKG 12-Lead  2. Colon cancer screening  - Ambulatory referral to Gastroenterology  3. Need for pneumococcal vaccine  - Pneumococcal conjugate vaccine 13-valent  4. Flu vaccine need  - Flu vaccine HIGH DOSE PF (Fluzone High dose)  5. Benign essential HTN   6. Hyperplasia, prostate Refer to Dr Bernardo Heater.  7. Mixed hyperlipidemia Stop Niacin. 8.Barretts Esophagus  I have done the exam and reviewed the chart and it is accurate to the best of my knowledge. Development worker, community has been used and  any errors in dictation or transcription are unintentional. Miguel Aschoff M.D. [facility]

## 2018-08-31 ENCOUNTER — Other Ambulatory Visit: Payer: Self-pay

## 2018-08-31 DIAGNOSIS — Z1211 Encounter for screening for malignant neoplasm of colon: Secondary | ICD-10-CM

## 2018-09-01 ENCOUNTER — Ambulatory Visit: Payer: Medicare Other | Admitting: Family Medicine

## 2018-09-01 ENCOUNTER — Ambulatory Visit (INDEPENDENT_AMBULATORY_CARE_PROVIDER_SITE_OTHER): Payer: Medicare Other | Admitting: Urology

## 2018-09-01 ENCOUNTER — Encounter: Payer: Self-pay | Admitting: Urology

## 2018-09-01 VITALS — BP 120/71 | HR 80 | Temp 99.2°F | Ht 70.0 in | Wt 212.8 lb

## 2018-09-01 DIAGNOSIS — N400 Enlarged prostate without lower urinary tract symptoms: Secondary | ICD-10-CM

## 2018-09-01 DIAGNOSIS — R972 Elevated prostate specific antigen [PSA]: Secondary | ICD-10-CM | POA: Diagnosis not present

## 2018-09-01 LAB — URINALYSIS, COMPLETE
Glucose, UA: NEGATIVE
Ketones, UA: NEGATIVE
Nitrite, UA: NEGATIVE
RBC, UA: NEGATIVE
Specific Gravity, UA: 1.015 (ref 1.005–1.030)
Urobilinogen, Ur: 1 mg/dL (ref 0.2–1.0)
pH, UA: 6 (ref 5.0–7.5)

## 2018-09-01 LAB — MICROSCOPIC EXAMINATION

## 2018-09-01 LAB — BLADDER SCAN AMB NON-IMAGING

## 2018-09-01 MED ORDER — CIPROFLOXACIN HCL 500 MG PO TABS: 500.0000 mg | ORAL_TABLET | Freq: Two times a day (BID) | ORAL | 0 refills | Status: AC

## 2018-09-01 NOTE — Progress Notes (Signed)
09/01/2018 11:14 AM   Lucas Christensen Sep 08, 1951 [id number]  Referring provider: Jerrol Banana., MD [address] [place], [place] [address]  Chief Complaint  Patient presents with  . Urinary Frequency    HPI: 67 year old male with a history of BPH and prostatitis noted onset of urinary frequency, urgency and dysuria yesterday.  His symptoms progressed and last night he had a temperature of 102 degrees.  He was voiding every 30 minutes and complained of dribbling and burning.  He denies gross hematuria.  He had a UroLift performed by Dr. Smitty Pluck in [place] over 1 year ago and noted significant improvement in his lower urinary tract symptoms after this procedure.   PMH: Past Medical History:  Diagnosis Date  . Barrett esophagus   . BPH (benign prostatic hyperplasia)   . BPH (benign prostatic hyperplasia)   . Calculus of both kidneys   . Chronic prostatitis   . Elevated PSA   . Hiatal hernia   . Hyperlipidemia   . Prostatitis   . Testicular hypofunction     Surgical History: Past Surgical History:  Procedure Laterality Date  . HERNIA REPAIR    . PROSTATE SURGERY    . SHOULDER SURGERY Left     Home Medications:  Allergies as of 09/01/2018   No Known Allergies     Medication List        Accurate as of 09/01/18 11:14 AM. Always use your most recent med list.          cholecalciferol 1000 units tablet Commonly known as:  VITAMIN D Take 1,000 Units by mouth daily.   niacin 500 MG CR capsule Take by mouth.   tadalafil 5 MG tablet Commonly known as:  CIALIS   vitamin C 1000 MG tablet Take by mouth.   vitamin E 1000 UNIT capsule Take by mouth.       Allergies: No Known Allergies  Family History: Family History  Problem Relation Age of Onset  . Hypertension Mother   . Heart attack Mother   . Arthritis Father   . Diabetes Father   . Hypertension Sister   . Diabetes Maternal Grandmother     Social History:  reports that he  has never smoked. He has never used smokeless tobacco. He reports that he does not drink alcohol or use drugs.  ROS: UROLOGY Frequent Urination?: Yes Hard to postpone urination?: Yes Burning/pain with urination?: Yes Get up at night to urinate?: Yes Leakage of urine?: Yes Urine stream starts and stops?: No Trouble starting stream?: Yes Do you have to strain to urinate?: Yes Blood in urine?: No Urinary tract infection?: No Sexually transmitted disease?: No Injury to kidneys or bladder?: No Painful intercourse?: No Weak stream?: No Erection problems?: No Penile pain?: No  Gastrointestinal Nausea?: No Vomiting?: No Indigestion/heartburn?: No Diarrhea?: No Constipation?: No  Constitutional Fever: Yes Night sweats?: No Weight loss?: No Fatigue?: No  Skin Skin rash/lesions?: No Itching?: No  Eyes Blurred vision?: No Double vision?: No  Ears/Nose/Throat Sore throat?: No Sinus problems?: No  Hematologic/Lymphatic Swollen glands?: No Easy bruising?: No  Cardiovascular Leg swelling?: No Chest pain?: No  Respiratory Cough?: No Shortness of breath?: No  Endocrine Excessive thirst?: No  Musculoskeletal Back pain?: No Joint pain?: No  Neurological Headaches?: No Dizziness?: No  Psychologic Depression?: No Anxiety?: No  Physical Exam: BP 120/71 (BP Location: Left Arm, Patient Position: Sitting, Cuff Size: Normal)   Pulse 80   Temp 99.2 F (37.3 C) (Oral)  Ht 5\' 10"  (1.778 m)   Wt 212 lb 12.8 oz (96.5 kg)   BMI 30.53 kg/m   Constitutional:  Alert and oriented, No acute distress. HEENT: [place] AT, moist mucus membranes.  Trachea midline, no masses. Cardiovascular: No clubbing, cyanosis, or edema. Respiratory: Normal respiratory effort, no increased work of breathing. GI: Abdomen is soft, nontender, nondistended, no abdominal masses GU: No CVA tenderness Lymph: No cervical or inguinal lymphadenopathy. Skin: No rashes, bruises or suspicious  lesions. Neurologic: Grossly intact, no focal deficits, moving all 4 extremities. Psychiatric: Normal mood and affect.  Laboratory Data:  Urinalysis Microscopy 11-30 RBC/11-30 WBC   Assessment & Plan:   67 year old male with acute prostatitis.  PVR by bladder scan today was 0 mL.  His urine was cultured and he will be notified with results.  Rx Cipro was sent to his pharmacy.  Will initially treat with a seven-day course of Cipro and after culture and sensitivities return will follow-up with additional antibiotic therapy.  He was instructed to call for continued temperature spikes/worsening symptoms.  Return in about 6 weeks (around 10/13/2018) for Recheck.  Abbie Sons, [place] [address] [place], [place] [address] [phone]

## 2018-09-05 LAB — CULTURE, URINE COMPREHENSIVE

## 2018-09-06 ENCOUNTER — Other Ambulatory Visit: Payer: Self-pay

## 2018-09-06 ENCOUNTER — Encounter: Payer: Self-pay | Admitting: *Deleted

## 2018-09-07 ENCOUNTER — Other Ambulatory Visit: Payer: Self-pay | Admitting: Urology

## 2018-09-07 MED ORDER — SULFAMETHOXAZOLE-TRIMETHOPRIM 800-160 MG PO TABS: 1.0000 | ORAL_TABLET | Freq: Two times a day (BID) | ORAL | 0 refills | Status: AC

## 2018-09-08 ENCOUNTER — Telehealth: Payer: Self-pay | Admitting: Family Medicine

## 2018-09-08 NOTE — Telephone Encounter (Signed)
-----   Message from Abbie Sons, MD sent at 09/07/2018  7:00 PM EDT ----- Urine culture was positive and sensitive to prescribed antibiotic.  I gave him 1 week worth of Cipro.  Rx Septra DS was sent to his pharmacy to take for an additional 3 weeks.

## 2018-09-08 NOTE — Telephone Encounter (Signed)
Patient notified and voiced understanding.

## 2018-09-10 NOTE — Discharge Instructions (Signed)
General Anesthesia, Adult, Care After °These instructions provide you with information about caring for yourself after your procedure. Your health care provider may also give you more specific instructions. Your treatment has been planned according to current medical practices, but problems sometimes occur. Call your health care provider if you have any problems or questions after your procedure. °What can I expect after the procedure? °After the procedure, it is common to have: °· Vomiting. °· A sore throat. °· Mental slowness. ° °It is common to feel: °· Nauseous. °· Cold or shivery. °· Sleepy. °· Tired. °· Sore or achy, even in parts of your body where you did not have surgery. ° °Follow these instructions at home: °For at least 24 hours after the procedure: °· Do not: °? Participate in activities where you could fall or become injured. °? Drive. °? Use heavy machinery. °? Drink alcohol. °? Take sleeping pills or medicines that cause drowsiness. °? Make important decisions or sign legal documents. °? Take care of children on your own. °· Rest. °Eating and drinking °· If you vomit, drink water, juice, or soup when you can drink without vomiting. °· Drink enough fluid to keep your urine clear or pale yellow. °· Make sure you have little or no nausea before eating solid foods. °· Follow the diet recommended by your health care provider. °General instructions °· Have a responsible adult stay with you until you are awake and alert. °· Return to your normal activities as told by your health care provider. Ask your health care provider what activities are safe for you. °· Take over-the-counter and prescription medicines only as told by your health care provider. °· If you smoke, do not smoke without supervision. °· Keep all follow-up visits as told by your health care provider. This is important. °Contact a health care provider if: °· You continue to have nausea or vomiting at home, and medicines are not helpful. °· You  cannot drink fluids or start eating again. °· You cannot urinate after 8-12 hours. °· You develop a skin rash. °· You have fever. °· You have increasing redness at the site of your procedure. °Get help right away if: °· You have difficulty breathing. °· You have chest pain. °· You have unexpected bleeding. °· You feel that you are having a life-threatening or urgent problem. °This information is not intended to replace advice given to you by your health care provider. Make sure you discuss any questions you have with your health care provider. °Document Released: 03/02/2001 Document Revised: 04/28/2016 Document Reviewed: 11/08/2015 °Elsevier Interactive Patient Education © 2018 Elsevier Inc[employer] ° °

## 2018-09-13 ENCOUNTER — Ambulatory Visit: Payer: Medicare Other | Admitting: Anesthesiology

## 2018-09-13 ENCOUNTER — Ambulatory Visit
Admission: RE | Admit: 2018-09-13 | Discharge: 2018-09-13 | Disposition: A | Discharge status: Home / Self Care | Payer: Medicare Other | Source: Ambulatory Visit | Attending: Gastroenterology | Admitting: Gastroenterology

## 2018-09-13 ENCOUNTER — Encounter
Admission: RE | Disposition: A | Discharge status: Home / Self Care | Payer: Self-pay | Source: Ambulatory Visit | Attending: Gastroenterology

## 2018-09-13 DIAGNOSIS — K449 Diaphragmatic hernia without obstruction or gangrene: Secondary | ICD-10-CM | POA: Insufficient documentation

## 2018-09-13 DIAGNOSIS — K219 Gastro-esophageal reflux disease without esophagitis: Secondary | ICD-10-CM | POA: Insufficient documentation

## 2018-09-13 DIAGNOSIS — Z8249 Family history of ischemic heart disease and other diseases of the circulatory system: Secondary | ICD-10-CM | POA: Diagnosis not present

## 2018-09-13 DIAGNOSIS — Z1211 Encounter for screening for malignant neoplasm of colon: Secondary | ICD-10-CM

## 2018-09-13 DIAGNOSIS — N400 Enlarged prostate without lower urinary tract symptoms: Secondary | ICD-10-CM | POA: Diagnosis not present

## 2018-09-13 DIAGNOSIS — Z8261 Family history of arthritis: Secondary | ICD-10-CM | POA: Insufficient documentation

## 2018-09-13 DIAGNOSIS — E785 Hyperlipidemia, unspecified: Secondary | ICD-10-CM | POA: Insufficient documentation

## 2018-09-13 DIAGNOSIS — K2270 Barrett's esophagus without dysplasia: Secondary | ICD-10-CM | POA: Insufficient documentation

## 2018-09-13 DIAGNOSIS — E291 Testicular hypofunction: Secondary | ICD-10-CM | POA: Insufficient documentation

## 2018-09-13 DIAGNOSIS — Z833 Family history of diabetes mellitus: Secondary | ICD-10-CM | POA: Diagnosis not present

## 2018-09-13 DIAGNOSIS — K640 First degree hemorrhoids: Secondary | ICD-10-CM | POA: Diagnosis not present

## 2018-09-13 DIAGNOSIS — Z87442 Personal history of urinary calculi: Secondary | ICD-10-CM | POA: Insufficient documentation

## 2018-09-13 DIAGNOSIS — Z79899 Other long term (current) drug therapy: Secondary | ICD-10-CM | POA: Diagnosis not present

## 2018-09-13 DIAGNOSIS — K5730 Diverticulosis of large intestine without perforation or abscess without bleeding: Secondary | ICD-10-CM | POA: Insufficient documentation

## 2018-09-13 HISTORY — PX: COLONOSCOPY WITH PROPOFOL: SHX5780

## 2018-09-13 SURGERY — COLONOSCOPY WITH PROPOFOL
Anesthesia: General | Site: Rectum

## 2018-09-13 MED ORDER — SODIUM CHLORIDE 0.9 % IV SOLN: INTRAVENOUS | Status: DC

## 2018-09-13 MED ORDER — ACETAMINOPHEN 160 MG/5ML PO SOLN: 325.0000 mg | Freq: Once | ORAL | Status: DC

## 2018-09-13 MED ORDER — ACETAMINOPHEN 325 MG PO TABS: 325.0000 mg | ORAL_TABLET | Freq: Once | ORAL | Status: DC

## 2018-09-13 MED ORDER — STERILE WATER FOR IRRIGATION IR SOLN
Status: DC | PRN
  Administered 2018-09-13: 09:00:00

## 2018-09-13 MED ORDER — LACTATED RINGERS IV SOLN
1000.0000 mL | INTRAVENOUS | Status: DC
  Administered 2018-09-13: 1000 mL via INTRAVENOUS

## 2018-09-13 MED ORDER — PROPOFOL 10 MG/ML IV BOLUS
INTRAVENOUS | Status: DC | PRN
  Administered 2018-09-13: 40 mg via INTRAVENOUS
  Administered 2018-09-13: 150 mg via INTRAVENOUS
  Administered 2018-09-13: 30 mg via INTRAVENOUS
  Administered 2018-09-13: 40 mg via INTRAVENOUS

## 2018-09-13 MED ORDER — LIDOCAINE HCL (CARDIAC) PF 100 MG/5ML IV SOSY
PREFILLED_SYRINGE | INTRAVENOUS | Status: DC | PRN
  Administered 2018-09-13: 40 mg via INTRAVENOUS

## 2018-09-13 SURGICAL SUPPLY — 5 items
CANISTER SUCT 1200ML W/VALVE (MISCELLANEOUS) ×3 IMPLANT
GOWN CVR UNV OPN BCK APRN NK (MISCELLANEOUS) ×2 IMPLANT
GOWN ISOL THUMB LOOP REG UNIV (MISCELLANEOUS) ×4
KIT ENDO PROCEDURE OLY (KITS) ×3 IMPLANT
WATER STERILE IRR 250ML POUR (IV SOLUTION) ×3 IMPLANT

## 2018-09-13 NOTE — H&P (Signed)
Lucas Lame, MD [address] [place], [place] [address] Phone: [phone] Fax : [phone]  Primary Care Physician:  Lucas Christensen., MD Primary Gastroenterologist:  Dr. Allen Norris  Pre-Procedure History & Physical: HPI:  Lucas Christensen is a 67 y.o. male is here for a screening colonoscopy.   Past Medical History:  Diagnosis Date  . Barrett esophagus   . BPH (benign prostatic hyperplasia)   . BPH (benign prostatic hyperplasia)   . Calculus of both kidneys   . Chronic prostatitis   . Elevated PSA   . Hiatal hernia   . Hyperlipidemia   . Prostatitis   . Testicular hypofunction     Past Surgical History:  Procedure Laterality Date  . HERNIA REPAIR    . PROSTATE SURGERY    . SHOULDER SURGERY Left     Prior to Admission medications   Medication Sig Start Date End Date Taking? Authorizing Provider  Ascorbic Acid (VITAMIN C) 1000 MG tablet Take by mouth.   Yes [provider]  cholecalciferol (VITAMIN D) 1000 units tablet Take 1,000 Units by mouth daily.   Yes [provider]  CREATINE PO Take by mouth daily.   Yes [provider]  Glucosamine HCl (GLUCOSAMINE PO) Take by mouth daily.   Yes [provider]  niacin 500 MG CR capsule Take by mouth. 06/08/07  Yes [provider]  sulfamethoxazole-trimethoprim (BACTRIM DS,SEPTRA DS) 800-160 MG tablet Take 1 tablet by mouth every 12 (twelve) hours for 21 days. 09/07/18 09/28/18 Yes Stoioff, Ronda Fairly, MD  tadalafil (CIALIS) 5 MG tablet  08/06/18  Yes [provider]  vitamin E 1000 UNIT capsule Take by mouth.   Yes [provider]    Allergies as of 08/31/2018  . (No Known Allergies)    Family History  Problem Relation Age of Onset  . Hypertension Mother   . Heart attack Mother   . Arthritis Father   . Diabetes Father   . Hypertension Sister   . Diabetes Maternal Grandmother     Social History   Socioeconomic History  . Marital status:  Married    Spouse name: Not on file  . Number of children: Not on file  . Years of education: Not on file  . Highest education level: Not on file  Occupational History  . Not on file  Social Needs  . Financial resource strain: Not on file  . Food insecurity:    Worry: Not on file    Inability: Not on file  . Transportation needs:    Medical: Not on file    Non-medical: Not on file  Tobacco Use  . Smoking status: Never Smoker  . Smokeless tobacco: Never Used  Substance and Sexual Activity  . Alcohol use: No    Comment: occasional wine on Holidays  . Drug use: No  . Sexual activity: Not on file  Lifestyle  . Physical activity:    Days per week: Not on file    Minutes per session: Not on file  . Stress: Not on file  Relationships  . Social connections:    Talks on phone: Not on file    Gets together: Not on file    Attends religious service: Not on file    Active member of club or organization: Not on file    Attends meetings of clubs or organizations: Not on file    Relationship status: Not on file  . Intimate partner violence:  Fear of current or ex partner: Not on file    Emotionally abused: Not on file    Physically abused: Not on file    Forced sexual activity: Not on file  Other Topics Concern  . Not on file  Social History Narrative  . Not on file    Review of Systems: See HPI, otherwise negative ROS  Physical Exam: BP 132/83   Pulse (!) 54   Temp 98.1 F (36.7 C) (Temporal)   Resp 16   Ht 5\' 10"  (1.778 m)   Wt 89.8 kg   SpO2 97%   BMI 28.41 kg/m  General:   Alert,  pleasant and cooperative in NAD Head:  Normocephalic and atraumatic. Neck:  Supple; no masses or thyromegaly. Lungs:  Clear throughout to auscultation.    Heart:  Regular rate and rhythm. Abdomen:  Soft, nontender and nondistended. Normal bowel sounds, without guarding, and without rebound.   Neurologic:  Alert and  oriented x4;  grossly normal  neurologically.  Impression/Plan: Lucas Christensen is now here to undergo a screening colonoscopy.  Risks, benefits, and alternatives regarding colonoscopy have been reviewed with the patient.  Questions have been answered.  All parties agreeable.

## 2018-09-13 NOTE — Anesthesia Procedure Notes (Signed)
Performed by: Omir Cooprider, CRNA Pre-anesthesia Checklist: Patient identified, Emergency Drugs available, Suction available, Timeout performed and Patient being monitored Patient Re-evaluated:Patient Re-evaluated prior to induction Oxygen Delivery Method: Nasal cannula Placement Confirmation: positive ETCO2       

## 2018-09-13 NOTE — Anesthesia Preprocedure Evaluation (Signed)
Anesthesia Evaluation  Patient identified by MRN, date of birth, ID band Patient awake    Reviewed: Allergy & Precautions, H&P , NPO status , Patient's Chart, lab work & pertinent test results  Airway Mallampati: II  TM Distance: >3 FB Neck ROM: full    Dental no notable dental hx.    Pulmonary    Pulmonary exam normal breath sounds clear to auscultation       Cardiovascular Normal cardiovascular exam Rhythm:regular Rate:Normal     Neuro/Psych    GI/Hepatic GERD  ,  Endo/Other    Renal/GU      Musculoskeletal   Abdominal   Peds  Hematology   Anesthesia Other Findings   Reproductive/Obstetrics                             Anesthesia Physical Anesthesia Plan  ASA: II  Anesthesia Plan: General   Post-op Pain Management:    Induction: Intravenous  PONV Risk Score and Plan: 2 and Propofol infusion and Treatment may vary due to age or medical condition  Airway Management Planned: Natural Airway  Additional Equipment:   Intra-op Plan:   Post-operative Plan:   Informed Consent: I have reviewed the patients History and Physical, chart, labs and discussed the procedure including the risks, benefits and alternatives for the proposed anesthesia with the patient or authorized representative who has indicated his/her understanding and acceptance.     Plan Discussed with: CRNA  Anesthesia Plan Comments:         Anesthesia Quick Evaluation

## 2018-09-13 NOTE — Anesthesia Postprocedure Evaluation (Signed)
Anesthesia Post Note  Patient: Lucas Christensen  Procedure(s) Performed: COLONOSCOPY WITH PROPOFOL (N/A Rectum)  Patient location during evaluation: PACU Anesthesia Type: General Level of consciousness: awake and alert and oriented Pain management: satisfactory to patient Vital Signs Assessment: post-procedure vital signs reviewed and stable Respiratory status: spontaneous breathing, nonlabored ventilation and respiratory function stable Cardiovascular status: blood pressure returned to baseline and stable Postop Assessment: Adequate PO intake and No signs of nausea or vomiting Anesthetic complications: no    Raliegh Ip

## 2018-09-13 NOTE — Op Note (Addendum)
[facility] Gastroenterology Patient Name: Lucas Christensen Procedure Date: 09/13/2018 8:23 AM MRN: [id number] Account #: [id number] Date of Birth: 1951-06-09 Admit Type: Outpatient Age: 67 Room: [facility] OR ROOM 01 Gender: Male Note Status: Finalized Procedure:            Colonoscopy Indications:          Screening for colorectal malignant neoplasm Providers:            Lucilla Lame MD, MD Referring MD:         Janine Ores. Rosanna Randy, MD (Referring MD) Medicines:            Propofol per Anesthesia Complications:        No immediate complications. Procedure:            Pre-Anesthesia Assessment:                       - Prior to the procedure, a History and Physical was                        performed, and patient medications and allergies were                        reviewed. The patient's tolerance of previous                        anesthesia was also reviewed. The risks and benefits of                        the procedure and the sedation options and risks were                        discussed with the patient. All questions were                        answered, and informed consent was obtained. Prior                        Anticoagulants: The patient has taken no previous                        anticoagulant or antiplatelet agents. ASA Grade                        Assessment: II - A patient with mild systemic disease.                        After reviewing the risks and benefits, the patient was                        deemed in satisfactory condition to undergo the                        procedure.                       After obtaining informed consent, the colonoscope was                        passed under direct vision. Throughout the procedure,  the patient's blood pressure, pulse, and oxygen                        saturations were monitored continuously. The was                        introduced through the anus and advanced to the the                  cecum, identified by appendiceal orifice and ileocecal                        valve. The colonoscopy was performed without                        difficulty. The patient tolerated the procedure well.                        The quality of the bowel preparation was excellent. Findings:      The perianal and digital rectal examinations were normal.      A few small-mouthed diverticula were found in the sigmoid colon.      Non-bleeding internal hemorrhoids were found during retroflexion. The       hemorrhoids were Grade I (internal hemorrhoids that do not prolapse). Impression:           - Diverticulosis in the sigmoid colon.                       - Non-bleeding internal hemorrhoids.                       - No specimens collected. Recommendation:       - Discharge patient to home.                       - Resume previous diet.                       - Continue present medications.                       - Repeat colonoscopy in 10 years for screening unless                        any change in family history or lower GI problems. Procedure Code(s):    --- Professional ---                       [phone], Colonoscopy, flexible; diagnostic, including                        collection of specimen(s) by brushing or washing, when                        performed (separate procedure) Diagnosis Code(s):    --- Professional ---                       Z12.11, Encounter for screening for malignant neoplasm                        of colon CPT copyright 2017 American Medical Association. All rights reserved. The  codes documented in this report are preliminary and upon coder review may  be revised to meet current compliance requirements. Lucilla Lame MD, MD 09/13/2018 8:46:01 AM This report has been signed electronically. Number of Addenda: 0 Note Initiated On: 09/13/2018 8:23 AM Scope Withdrawal Time: 0 hours 6 minutes 45 seconds  Total Procedure Duration: 0 hours 9 minutes 30 seconds        [facility]

## 2018-09-13 NOTE — Transfer of Care (Signed)
Immediate Anesthesia Transfer of Care Note  Patient: Lucas Christensen  Procedure(s) Performed: COLONOSCOPY WITH PROPOFOL (N/A Rectum)  Patient Location: PACU  Anesthesia Type: General  Level of Consciousness: awake, alert  and patient cooperative  Airway and Oxygen Therapy: Patient Spontanous Breathing and Patient connected to supplemental oxygen  Post-op Assessment: Post-op Vital signs reviewed, Patient's Cardiovascular Status Stable, Respiratory Function Stable, Patent Airway and No signs of Nausea or vomiting  Post-op Vital Signs: Reviewed and stable  Complications: No apparent anesthesia complications

## 2018-09-15 ENCOUNTER — Encounter: Payer: Self-pay | Admitting: Gastroenterology

## 2018-09-20 ENCOUNTER — Ambulatory Visit: Payer: Self-pay | Admitting: Family Medicine

## 2018-10-14 ENCOUNTER — Ambulatory Visit: Payer: Medicare Other | Admitting: Urology

## 2018-10-14 ENCOUNTER — Encounter: Payer: Self-pay | Admitting: Urology

## 2018-10-14 VITALS — BP 129/79 | HR 60 | Ht 70.0 in | Wt 213.8 lb

## 2018-10-14 DIAGNOSIS — N400 Enlarged prostate without lower urinary tract symptoms: Secondary | ICD-10-CM | POA: Diagnosis not present

## 2018-10-14 DIAGNOSIS — R972 Elevated prostate specific antigen [PSA]: Secondary | ICD-10-CM

## 2018-10-14 DIAGNOSIS — N410 Acute prostatitis: Secondary | ICD-10-CM

## 2018-10-14 LAB — URINALYSIS, COMPLETE
Bilirubin, UA: NEGATIVE
Glucose, UA: NEGATIVE
Ketones, UA: NEGATIVE
LEUKOCYTES UA: NEGATIVE
NITRITE UA: NEGATIVE
PROTEIN UA: NEGATIVE
RBC UA: NEGATIVE
Specific Gravity, UA: >1.03 — ABNORMAL HIGH (ref 1.005–1.030)
Urobilinogen, Ur: 0.2 mg/dL (ref 0.2–1.0)
pH, UA: 5 (ref 5.0–7.5)

## 2018-10-15 ENCOUNTER — Ambulatory Visit: Payer: Medicare Other | Admitting: Urology

## 2018-10-15 LAB — PSA: PROSTATE SPECIFIC AG, SERUM: 4.6 ng/mL — AB (ref 0.0–4.0)

## 2018-10-15 NOTE — Progress Notes (Signed)
10/14/2018 12:22 PM   Lucas Christensen December 11, 1950 [id number]  Referring provider: Jerrol Banana., MD [address] [place], [place] [address]  Chief Complaint  Patient presents with  . Follow-up   Urologic history: 1.  History of stone disease - status post left ureteroscopic stone removal May 2014  2.  History elevated PSA -Baseline PSA upper 1/low 2 range; intermittent elevation as high as 6.1 -No previous biopsy  3.  BPH with lower urinary tract symptoms -Status post UroLift in [place] 2018  4.  Episode acute prostatitis September 4943   HPI: 67 year old male presents for follow-up.  I saw him in late September 2019 with acute prostatitis.  His urine culture grew Raoultella planticola which was sensitive to the prescribed antibiotic.  He was treated with a 3-week course of antibiotics and had resolution of his symptoms.  He has a history of BPH status post UroLift approximately 2 years ago.  He has had a fluctuating PSA that has gone as high as 6.1 however returned to baseline.  His last PSA in June 2018 was 4.6 which is elevated above baseline.  He presently has no complaints.   PMH: Past Medical History:  Diagnosis Date  . Barrett esophagus   . BPH (benign prostatic hyperplasia)   . BPH (benign prostatic hyperplasia)   . Calculus of both kidneys   . Chronic prostatitis   . Elevated PSA   . Hiatal hernia   . Hyperlipidemia   . Prostatitis   . Testicular hypofunction     Surgical History: Past Surgical History:  Procedure Laterality Date  . COLONOSCOPY WITH PROPOFOL N/A 09/13/2018   Procedure: COLONOSCOPY WITH PROPOFOL;  Surgeon: Lucilla Lame, MD;  Location: [place];  Service: Endoscopy;  Laterality: N/A;  . HERNIA REPAIR    . PROSTATE SURGERY    . SHOULDER SURGERY Left     Home Medications:  Allergies as of 10/14/2018   No Known Allergies     Medication List        Accurate as of 10/14/18 11:59 PM. Always use your most  recent med list.          cholecalciferol 1000 units tablet Commonly known as:  VITAMIN D Take 1,000 Units by mouth daily.   CREATINE PO Take by mouth daily.   GLUCOSAMINE PO Take by mouth daily.   niacin 500 MG CR capsule Take by mouth.   SUPREP BOWEL PREP KIT 17.5-3.13-1.6 GM/177ML Soln Generic drug:  Na Sulfate-K Sulfate-Mg Sulf See admin instructions.   tadalafil 5 MG tablet Commonly known as:  CIALIS   vitamin C 1000 MG tablet Take by mouth.   vitamin E 1000 UNIT capsule Take by mouth.       Allergies: No Known Allergies  Family History: Family History  Problem Relation Age of Onset  . Hypertension Mother   . Heart attack Mother   . Arthritis Father   . Diabetes Father   . Hypertension Sister   . Diabetes Maternal Grandmother     Social History:  reports that he has never smoked. He has never used smokeless tobacco. He reports that he does not drink alcohol or use drugs.  ROS: UROLOGY Frequent Urination?: No Hard to postpone urination?: No Burning/pain with urination?: No Get up at night to urinate?: No Leakage of urine?: No Urine stream starts and stops?: No Trouble starting stream?: No Do you have to strain to urinate?: No Blood in urine?: No Urinary tract infection?: No  Sexually transmitted disease?: No Injury to kidneys or bladder?: No Painful intercourse?: No Weak stream?: No Erection problems?: No Penile pain?: No  Gastrointestinal Nausea?: No Vomiting?: No Indigestion/heartburn?: No Diarrhea?: No Constipation?: No  Constitutional Fever: No Night sweats?: No Weight loss?: No Fatigue?: No  Skin Skin rash/lesions?: No Itching?: No  Eyes Blurred vision?: No Double vision?: No  Ears/Nose/Throat Sore throat?: No Sinus problems?: No  Hematologic/Lymphatic Swollen glands?: No Easy bruising?: No  Cardiovascular Leg swelling?: No Chest pain?: No  Respiratory Cough?: No Shortness of breath?:  No  Endocrine Excessive thirst?: No  Musculoskeletal Back pain?: No Joint pain?: No  Neurological Headaches?: No Dizziness?: No  Psychologic Depression?: No Anxiety?: No  Physical Exam: BP 129/79 (BP Location: Left Arm, Patient Position: Sitting, Cuff Size: Large)   Pulse 60   Ht '5\' 10"'  (1.778 m)   Wt 213 lb 12.8 oz (97 kg)   BMI 30.68 kg/m   Constitutional:  Alert and oriented, No acute distress. HEENT: [place] AT, moist mucus membranes.  Trachea midline, no masses. Cardiovascular: No clubbing, cyanosis, or edema. Respiratory: Normal respiratory effort, no increased work of breathing. GI: Abdomen is soft, nontender, nondistended, no abdominal masses GU: No CVA tenderness.  Prostate 60 g, smooth without nodules Lymph: No cervical or inguinal lymphadenopathy. Skin: No rashes, bruises or suspicious lesions. Neurologic: Grossly intact, no focal deficits, moving all 4 extremities. Psychiatric: Normal mood and affect.  Laboratory Data:  Urinalysis Dipstick/microscopy negative  Assessment & Plan:   67 year old male with recent episode of acute prostatitis.  Urinalysis today was clear.  He is presently asymptomatic.  He requested a PSA be drawn today.  He was instructed to call for recurrent UTI symptoms and will otherwise continue annual follow-up.   Return in about 1 year (around 10/15/2019) for Recheck.  Abbie Sons, [place] [address], [name], [place] [address] [phone]

## 2018-10-19 ENCOUNTER — Other Ambulatory Visit: Payer: Self-pay | Admitting: Family Medicine

## 2018-10-19 ENCOUNTER — Telehealth: Payer: Self-pay

## 2018-10-19 NOTE — Telephone Encounter (Signed)
Called pt, no answer. LM for pt per DPR informing him of the information below. Advised pt to call back for questions or concerns.  

## 2018-10-19 NOTE — Telephone Encounter (Signed)
-----   Message from Abbie Sons, MD sent at 10/15/2018  8:07 PM EST ----- PSA is 4.6 which is what it was in June 2018.  It is still elevated above the level of 2016 and 2017.  It may be slightly elevated due to recent infection.  Would recheck in 3 months.

## 2018-10-24 ENCOUNTER — Encounter: Payer: Self-pay | Admitting: Urology

## 2019-01-13 ENCOUNTER — Telehealth: Payer: Self-pay | Admitting: Family Medicine

## 2019-01-13 DIAGNOSIS — R972 Elevated prostate specific antigen [PSA]: Secondary | ICD-10-CM

## 2019-01-13 NOTE — Telephone Encounter (Signed)
Pt wanting to go ahead and have his order for blood work done.  He is also wanting his PSA level tested.  Please call pt when orders are ready.  Thanks, [employer]

## 2019-01-13 NOTE — Telephone Encounter (Signed)
Ok to order? If so, what tests? Please advise. Thanks!

## 2019-02-01 NOTE — Telephone Encounter (Signed)
ok 

## 2019-02-01 NOTE — Telephone Encounter (Signed)
Dr Darnell Level spoke with wife to inform patient he wold need to have ov

## 2019-02-07 ENCOUNTER — Other Ambulatory Visit: Payer: Self-pay

## 2019-02-07 ENCOUNTER — Encounter: Payer: Self-pay | Admitting: Family Medicine

## 2019-02-07 ENCOUNTER — Ambulatory Visit: Payer: Medicare Other | Admitting: Family Medicine

## 2019-02-07 VITALS — BP 106/78 | HR 67 | Temp 98.3°F | Ht 70.0 in | Wt 211.2 lb

## 2019-02-07 DIAGNOSIS — I10 Essential (primary) hypertension: Secondary | ICD-10-CM

## 2019-02-07 DIAGNOSIS — E782 Mixed hyperlipidemia: Secondary | ICD-10-CM

## 2019-02-07 DIAGNOSIS — K2100 Gastro-esophageal reflux disease with esophagitis, without bleeding: Secondary | ICD-10-CM

## 2019-02-07 DIAGNOSIS — E559 Vitamin D deficiency, unspecified: Secondary | ICD-10-CM

## 2019-02-07 DIAGNOSIS — R972 Elevated prostate specific antigen [PSA]: Secondary | ICD-10-CM | POA: Diagnosis not present

## 2019-02-07 NOTE — Progress Notes (Signed)
Patient: Lucas Christensen Male    DOB: 1951-10-11   68 y.o.   MRN: [id number] Visit Date: 02/07/2019  Today's Provider: Wilhemena Durie, MD   No chief complaint on file.  Subjective:     HPI  Pt reports he is being seen to get labs done for a PSA, and also general labs.  Pt reports he had a flare of prostatitis in early October 2019.  Pt reports he has been having a burning feeling in his left shoulder ( back of arm and shoulder blade) since 1 year. He feels well for the most part.  Did  run a marathon last year.  He continues to workout some.  He has an elevated PSA been followed by Dr. Bernardo Heater.  He would like to have that rechecked which Dr. Bernardo Heater recommended about now.  He has not had lab work otherwise in a couple of years. No Known Allergies   Current Outpatient Medications:  .  Ascorbic Acid (VITAMIN C) 1000 MG tablet, Take by mouth., Disp: , Rfl:  .  cholecalciferol (VITAMIN D) 1000 units tablet, Take 1,000 Units by mouth daily., Disp: , Rfl:  .  Glucosamine HCl (GLUCOSAMINE PO), Take by mouth daily., Disp: , Rfl:  .  Multiple Vitamin (MULTIVITAMIN) tablet, Take 1 tablet by mouth daily., Disp: , Rfl:  .  niacin 500 MG CR capsule, Take by mouth., Disp: , Rfl:  .  tadalafil (CIALIS) 5 MG tablet, , Disp: , Rfl:  .  vitamin E 1000 UNIT capsule, Take by mouth., Disp: , Rfl:  .  CREATINE PO, Take by mouth daily., Disp: , Rfl:  .  SUPREP BOWEL PREP KIT 17.5-3.13-1.6 GM/177ML SOLN, See admin instructions., Disp: , Rfl: 0  Review of Systems  Constitutional: Negative.   HENT: Negative.   Eyes: Negative.   Respiratory: Negative.   Cardiovascular: Negative.   Gastrointestinal: Negative.   Endocrine: Negative.   Genitourinary: Negative.   Musculoskeletal: Negative.   Skin: Negative.   Allergic/Immunologic: Negative.   Neurological: Negative.   Hematological: Negative.   Psychiatric/Behavioral: Negative.     Social History   Tobacco Use  . Smoking status: Never  Smoker  . Smokeless tobacco: Never Used  Substance Use Topics  . Alcohol use: No    Comment: occasional wine on Holidays      Objective:   BP 106/78 (BP Location: Left Arm, Patient Position: Sitting, Cuff Size: Normal)   Pulse 67   Temp 98.3 F (36.8 C) (Oral)   Ht _0  (1.778 m)   Wt 211 lb 3.2 oz (95.8 kg)   SpO2 96%   BMI 30.30 kg/m  Vitals:   02/07/19 1055  BP: 106/78  Pulse: 67  Temp: 98.3 F (36.8 C)  TempSrc: Oral  SpO2: 96%  Weight: 211 lb 3.2 oz (95.8 kg)  Height: _1  (1.778 m)     Physical Exam Constitutional:      Appearance: He is well-developed.  HENT:     Head: Normocephalic and atraumatic.     Right Ear: External ear normal.     Left Ear: External ear normal.     Nose: Nose normal.  Eyes:     Conjunctiva/sclera: Conjunctivae normal.     Pupils: Pupils are equal, round, and reactive to light.  Neck:     Musculoskeletal: Normal range of motion and neck supple.  Cardiovascular:     Rate and Rhythm: Normal rate and regular rhythm.  Heart sounds: Normal heart sounds.  Pulmonary:     Effort: Pulmonary effort is normal.     Breath sounds: Normal breath sounds.  Abdominal:     General: Bowel sounds are normal.     Palpations: Abdomen is soft.  Genitourinary:    Penis: Normal.      Prostate: Normal.     Rectum: Normal.  Musculoskeletal: Normal range of motion.     Right lower leg: No edema.     Left lower leg: No edema.  Skin:    General: Skin is warm and dry.     Comments: New skin rash on back where tingling  is occurring  Neurological:     Mental Status: He is alert and oriented to person, place, and time.  Psychiatric:        Behavior: Behavior normal.        Thought Content: Thought content normal.        Judgment: Judgment normal.         Assessment & Plan    1. Benign essential HTN  - CBC with Differential/Platelet - Comprehensive metabolic panel  2. Mixed hyperlipidemia  - Lipid panel - TSH  3. Elevated  prostate specific antigen (PSA) Followed by Dr. Bernardo Heater. - PSA  4. Gastroesophageal reflux disease with esophagitis Presently asymptomatic and untreated.  5. Avitaminosis D     I have done the exam and reviewed the above chart and it is accurate to the best of my knowledge. Development worker, community has been used in this note in any air is in the dictation or transcription are unintentional.  Wilhemena Durie, MD  [place]

## 2019-02-08 LAB — LIPID PANEL
Chol/HDL Ratio: 4 ratio (ref 0.0–5.0)
Cholesterol, Total: 218 mg/dL — ABNORMAL HIGH (ref 100–199)
HDL: 55 mg/dL (ref >39)
LDL Calculated: 147 mg/dL — ABNORMAL HIGH (ref 0–99)
Triglycerides: 82 mg/dL (ref 0–149)
VLDL CHOLESTEROL CAL: 16 mg/dL (ref 5–40)

## 2019-02-08 LAB — CBC WITH DIFFERENTIAL/PLATELET
BASOS ABS: 0.1 10*3/uL (ref 0.0–0.2)
Basos: 1 %
EOS (ABSOLUTE): 0.1 10*3/uL (ref 0.0–0.4)
EOS: 1 %
Hematocrit: 43.6 % (ref 37.5–51.0)
Hemoglobin: 16.2 g/dL (ref 13.0–17.7)
IMMATURE GRANS (ABS): 0 10*3/uL (ref 0.0–0.1)
IMMATURE GRANULOCYTES: 0 %
LYMPHS: 30 %
Lymphocytes Absolute: 2.1 10*3/uL (ref 0.7–3.1)
MCH: 34.5 pg — ABNORMAL HIGH (ref 26.6–33.0)
MCHC: 37.2 g/dL — ABNORMAL HIGH (ref 31.5–35.7)
MCV: 93 fL (ref 79–97)
Monocytes Absolute: 0.6 10*3/uL (ref 0.1–0.9)
Monocytes: 9 %
Neutrophils Absolute: 3.9 10*3/uL (ref 1.4–7.0)
Neutrophils: 59 %
PLATELETS: 215 10*3/uL (ref 150–450)
RBC: 4.7 x10E6/uL (ref 4.14–5.80)
RDW: 12.8 % (ref 11.6–15.4)
WBC: 6.8 10*3/uL (ref 3.4–10.8)

## 2019-02-08 LAB — COMPREHENSIVE METABOLIC PANEL
ALT: 25 IU/L (ref 0–44)
AST: 30 IU/L (ref 0–40)
Albumin/Globulin Ratio: 1.5 (ref 1.2–2.2)
Albumin: 4.4 g/dL (ref 3.8–4.8)
Alkaline Phosphatase: 88 IU/L (ref 39–117)
BILIRUBIN TOTAL: 1.1 mg/dL (ref 0.0–1.2)
BUN/Creatinine Ratio: 14 (ref 10–24)
BUN: 15 mg/dL (ref 8–27)
CO2: 22 mmol/L (ref 20–29)
CREATININE: 1.05 mg/dL (ref 0.76–1.27)
Calcium: 9.5 mg/dL (ref 8.6–10.2)
Chloride: 101 mmol/L (ref 96–106)
GFR calc Af Amer: 85 mL/min/{1.73_m2} (ref >59)
GFR calc non Af Amer: 74 mL/min/{1.73_m2} (ref >59)
GLUCOSE: 85 mg/dL (ref 65–99)
Globulin, Total: 2.9 g/dL (ref 1.5–4.5)
Potassium: 4.7 mmol/L (ref 3.5–5.2)
Sodium: 140 mmol/L (ref 134–144)
Total Protein: 7.3 g/dL (ref 6.0–8.5)

## 2019-02-08 LAB — TSH: TSH: 2.17 u[IU]/mL (ref 0.450–4.500)

## 2019-02-08 LAB — PSA: Prostate Specific Ag, Serum: 2.8 ng/mL (ref 0.0–4.0)

## 2019-03-28 ENCOUNTER — Encounter: Payer: Self-pay | Admitting: Urology

## 2019-03-28 ENCOUNTER — Ambulatory Visit: Payer: Medicare Other | Admitting: Urology

## 2019-03-28 ENCOUNTER — Telehealth: Payer: Self-pay | Admitting: Urology

## 2019-03-28 ENCOUNTER — Other Ambulatory Visit: Payer: Self-pay

## 2019-03-28 VITALS — BP 128/80 | HR 77 | Temp 97.9°F | Ht 70.0 in | Wt 200.0 lb

## 2019-03-28 DIAGNOSIS — R972 Elevated prostate specific antigen [PSA]: Secondary | ICD-10-CM

## 2019-03-28 DIAGNOSIS — N410 Acute prostatitis: Secondary | ICD-10-CM | POA: Diagnosis not present

## 2019-03-28 DIAGNOSIS — N419 Inflammatory disease of prostate, unspecified: Secondary | ICD-10-CM | POA: Diagnosis not present

## 2019-03-28 LAB — URINALYSIS, COMPLETE
Bilirubin, UA: NEGATIVE
Glucose, UA: NEGATIVE
Ketones, UA: NEGATIVE
Leukocytes,UA: NEGATIVE
Nitrite, UA: NEGATIVE
RBC, UA: NEGATIVE
Specific Gravity, UA: >1.03 — ABNORMAL HIGH (ref 1.005–1.030)
Urobilinogen, Ur: 0.2 mg/dL (ref 0.2–1.0)
pH, UA: 5 (ref 5.0–7.5)

## 2019-03-28 LAB — MICROSCOPIC EXAMINATION: WBC, UA: >30 /hpf — AB (ref 0–5)

## 2019-03-28 LAB — BLADDER SCAN AMB NON-IMAGING

## 2019-03-28 MED ORDER — TADALAFIL 5 MG PO TABS: 5.0000 mg | ORAL_TABLET | Freq: Every day | ORAL | 3 refills | Status: DC

## 2019-03-28 MED ORDER — SULFAMETHOXAZOLE-TRIMETHOPRIM 800-160 MG PO TABS: 1.0000 | ORAL_TABLET | Freq: Two times a day (BID) | ORAL | 0 refills | Status: DC

## 2019-03-28 NOTE — Telephone Encounter (Signed)
He needs to see by [place].

## 2019-03-28 NOTE — Progress Notes (Signed)
03/28/2019 3:03 PM   Lucas Christensen 1951-10-04 [id number]  Referring provider: Jerrol Christensen., MD [address] [place], [place] [address]  Chief Complaint  Patient presents with  . Prostatitis    HPI: Lucas Christensen is a 68 year old Caucasian male with a history of prostatitis, history of elevated PSA, history of kidney stone disease and BPH with LU TS who presents today requesting an urgent appointment.  He states Friday, he felt terrible with all over body aches.  He stated he had urgency and frequency as well.  He is having dysuria.  Patient denies any gross hematuria or suprapubic/flank pain.  He has been having low grade fevers and chills.  Patient denies any nausea or vomiting.    He feels somewhat better today.  His UA is positive for > 30 WBC's.    He has also been without his Cialis 5 mg daily for two weeks and would like a refill.    He also mentions that his PSA has been elevated and remains elevated over his baseline at 2.8.  He would like a recheck on this at some point.  PMH: Past Medical History:  Diagnosis Date  . Barrett esophagus   . BPH (benign prostatic hyperplasia)   . BPH (benign prostatic hyperplasia)   . Calculus of both kidneys   . Chronic prostatitis   . Elevated PSA   . Hiatal hernia   . Hyperlipidemia   . Prostatitis   . Testicular hypofunction     Surgical History: Past Surgical History:  Procedure Laterality Date  . COLONOSCOPY WITH PROPOFOL N/A 09/13/2018   Procedure: COLONOSCOPY WITH PROPOFOL;  Surgeon: Lucas Lame, MD;  Location: [place];  Service: Endoscopy;  Laterality: N/A;  . HERNIA REPAIR    . PROSTATE SURGERY    . SHOULDER SURGERY Left     Home Medications:  Allergies as of 03/28/2019   No Known Allergies     Medication List       Accurate as of March 28, 2019  3:03 PM. Always use your most recent med list.        cholecalciferol 1000 units tablet Commonly known as:  VITAMIN D Take  1,000 Units by mouth daily.   CREATINE PO Take by mouth daily.   GLUCOSAMINE PO Take by mouth daily.   multivitamin tablet Take 1 tablet by mouth daily.   niacin 500 MG CR capsule Take by mouth.   sulfamethoxazole-trimethoprim 800-160 MG tablet Commonly known as:  BACTRIM DS Take 1 tablet by mouth every 12 (twelve) hours.   Suprep Bowel Prep Kit 17.5-3.13-1.6 GM/177ML Soln Generic drug:  Na Sulfate-K Sulfate-Mg Sulf See admin instructions.   tadalafil 5 MG tablet Commonly known as:  CIALIS Take 1 tablet (5 mg total) by mouth daily.   vitamin C 1000 MG tablet Take by mouth.   vitamin E 1000 UNIT capsule Take by mouth.       Allergies: No Known Allergies  Family History: Family History  Problem Relation Age of Onset  . Hypertension Mother   . Heart attack Mother   . Arthritis Father   . Diabetes Father   . Hypertension Sister   . Diabetes Maternal Grandmother     Social History:  reports that he has never smoked. He has never used smokeless tobacco. He reports that he does not drink alcohol or use drugs.  ROS: UROLOGY Frequent Urination?: Yes Hard to postpone urination?: Yes Burning/pain with urination?: Yes  Get up at night to urinate?: Yes Leakage of urine?: Yes Urine stream starts and stops?: No Trouble starting stream?: No Do you have to strain to urinate?: No Blood in urine?: No Urinary tract infection?: No Sexually transmitted disease?: No Injury to kidneys or bladder?: No Painful intercourse?: No Weak stream?: Yes Erection problems?: No Penile pain?: No  Gastrointestinal Nausea?: No Vomiting?: No Indigestion/heartburn?: No Diarrhea?: No Constipation?: No  Constitutional Fever: Yes Night sweats?: No Weight loss?: No Fatigue?: No  Skin Skin rash/lesions?: No Itching?: No  Eyes Blurred vision?: No Double vision?: No  Ears/Nose/Throat Sore throat?: No Sinus problems?: No  Hematologic/Lymphatic Swollen glands?: No Easy  bruising?: No  Cardiovascular Leg swelling?: No Chest pain?: No  Respiratory Cough?: No Shortness of breath?: No  Endocrine Excessive thirst?: No  Musculoskeletal Back pain?: No Joint pain?: No  Neurological Headaches?: No Dizziness?: No  Psychologic Depression?: No Anxiety?: No  Physical Exam: BP 128/80 (BP Location: Left Arm, Patient Position: Sitting)   Pulse 77   Temp 97.9 F (36.6 C)   Ht '5\' 10"'  (1.778 m)   Wt 200 lb (90.7 kg)   BMI 28.70 kg/m   Constitutional:  Well nourished. Alert and oriented, No acute distress. HEENT: [place] AT, moist mucus membranes.  Trachea midline, no masses. Cardiovascular: No clubbing, cyanosis, or edema. Respiratory: Normal respiratory effort, no increased work of breathing. Neurologic: Grossly intact, no focal deficits, moving all 4 extremities. Psychiatric: Normal mood and affect.  Laboratory Data: Lab Results  Component Value Date   WBC 6.8 02/07/2019   HGB 16.2 02/07/2019   HCT 43.6 02/07/2019   MCV 93 02/07/2019   PLT 215 02/07/2019    Lab Results  Component Value Date   CREATININE 1.05 02/07/2019    No results found for: PSA  Lab Results  Component Value Date   TESTOSTERONE 414 05/26/2017    No results found for: HGBA1C  Lab Results  Component Value Date   TSH 2.170 02/07/2019       Component Value Date/Time   CHOL 218 (H) 02/07/2019 1204   HDL 55 02/07/2019 1204   CHOLHDL 4.0 02/07/2019 1204   LDLCALC 147 (H) 02/07/2019 1204    Lab Results  Component Value Date   AST 30 02/07/2019   Lab Results  Component Value Date   ALT 25 02/07/2019   No components found for: ALKALINEPHOPHATASE No components found for: BILIRUBINTOTAL  No results found for: ESTRADIOL  Urinalysis >30 WBC's.  See Epic.  I have reviewed the labs.   Pertinent Imaging: Results for Lucas, Christensen (MRN [id number]) as of 03/28/2019 16:17  Ref. Range 03/28/2019 14:43  Scan Result Unknown 53m     Assessment & Plan:     1. Prostatitis - script send for Septra DS - will send UA for culture - instructed to contact the office or seek treatment in the ED if symptoms worsen or if temperature continues to increase  2. Elevated PSA - recent PSA on 02/07/2019 not to patient's baseline - he would like it repeated  - recheck PSA in June   Return for June for PSA only .  These notes generated with voice recognition software. I apologize for typographical errors.  SZara Council PA-C  [facility] [address] [place] [phone]

## 2019-03-28 NOTE — Telephone Encounter (Signed)
Pt called and states that he is having another case of prostatitis. He is having body aches and pain in hip area and back pain, he is very fatigued. Low grade fever.  Please advise.

## 2019-03-28 NOTE — Telephone Encounter (Signed)
Would he be able to come in this afternoon?

## 2019-03-28 NOTE — Telephone Encounter (Signed)
Pt seen in clinic

## 2019-03-30 ENCOUNTER — Telehealth: Payer: Self-pay | Admitting: *Deleted

## 2019-03-30 LAB — CULTURE, URINE COMPREHENSIVE

## 2019-03-30 NOTE — Telephone Encounter (Signed)
Iniated PA for Tadalafil 5 mg  Key: [id number]

## 2019-03-31 ENCOUNTER — Telehealth: Payer: Self-pay

## 2019-03-31 NOTE — Telephone Encounter (Signed)
-----   Message from Nori Riis, PA-C sent at 03/30/2019  4:37 PM EDT ----- Please let Mr. Chiara know that his urine culture was negative.  If he is feeling better, he can stop his antibiotic.

## 2019-03-31 NOTE — Telephone Encounter (Signed)
Left detailed message on vmail per DPR

## 2019-04-04 ENCOUNTER — Telehealth: Payer: Self-pay

## 2019-04-04 NOTE — Telephone Encounter (Signed)
Incoming letter from [facility] for Tadalafil 5mg . They state "medication authorization requires the following: You have a trail and failure, contraindication, or intolerance to two alpha blocker alternatives. Insurance will inform patient.

## 2019-05-25 ENCOUNTER — Other Ambulatory Visit: Payer: Self-pay

## 2019-05-25 DIAGNOSIS — R972 Elevated prostate specific antigen [PSA]: Secondary | ICD-10-CM

## 2019-05-26 ENCOUNTER — Other Ambulatory Visit: Payer: Self-pay

## 2019-05-26 ENCOUNTER — Other Ambulatory Visit: Payer: Medicare Other

## 2019-05-26 DIAGNOSIS — R972 Elevated prostate specific antigen [PSA]: Secondary | ICD-10-CM

## 2019-05-27 LAB — PSA: Prostate Specific Ag, Serum: 4.1 ng/mL — ABNORMAL HIGH (ref 0.0–4.0)

## 2019-06-06 ENCOUNTER — Telehealth: Payer: Self-pay | Admitting: Family Medicine

## 2019-06-06 NOTE — Telephone Encounter (Signed)
Ok to order. o

## 2019-06-06 NOTE — Telephone Encounter (Signed)
Pt called saying Saturday he was exposed to Covid 19 and he wants to be tested.  He is a Production manager.  CB#  [phone]  Thanks Con Memos

## 2019-06-07 ENCOUNTER — Other Ambulatory Visit: Payer: Self-pay

## 2019-06-07 ENCOUNTER — Telehealth: Payer: Self-pay | Admitting: *Deleted

## 2019-06-07 DIAGNOSIS — Z20822 Contact with and (suspected) exposure to covid-19: Secondary | ICD-10-CM

## 2019-06-07 NOTE — Telephone Encounter (Signed)
Orders placed for COVID test. Patient was notified that he will be contacted.

## 2019-06-07 NOTE — Telephone Encounter (Signed)
Patient's wife called concerning covid-19 test? Wife is very upset!! Please advise?

## 2019-06-07 NOTE — Telephone Encounter (Signed)
Pt scheduled for covid testing today @ 3:45 @ [facility]. Instructions given and order placed

## 2019-06-14 LAB — NOVEL CORONAVIRUS, NAA: SARS-CoV-2, NAA: NOT DETECTED

## 2019-07-22 ENCOUNTER — Other Ambulatory Visit: Payer: Self-pay

## 2019-07-22 ENCOUNTER — Ambulatory Visit: Payer: Medicare Other | Admitting: Urology

## 2019-07-22 ENCOUNTER — Encounter: Payer: Self-pay | Admitting: Urology

## 2019-07-22 VITALS — BP 120/72 | HR 71 | Ht 70.0 in | Wt 200.0 lb

## 2019-07-22 DIAGNOSIS — R972 Elevated prostate specific antigen [PSA]: Secondary | ICD-10-CM

## 2019-07-22 DIAGNOSIS — R8281 Pyuria: Secondary | ICD-10-CM

## 2019-07-22 DIAGNOSIS — Z87438 Personal history of other diseases of male genital organs: Secondary | ICD-10-CM | POA: Diagnosis not present

## 2019-07-22 LAB — URINALYSIS, COMPLETE
Bilirubin, UA: NEGATIVE
Glucose, UA: NEGATIVE
Nitrite, UA: POSITIVE — AB
Protein,UA: NEGATIVE
RBC, UA: NEGATIVE
Specific Gravity, UA: 1.025 (ref 1.005–1.030)
Urobilinogen, Ur: 0.2 mg/dL (ref 0.2–1.0)
pH, UA: 6.5 (ref 5.0–7.5)

## 2019-07-22 LAB — MICROSCOPIC EXAMINATION
Bacteria, UA: NONE SEEN
Epithelial Cells (non renal): NONE SEEN /hpf (ref 0–10)

## 2019-07-22 NOTE — Progress Notes (Signed)
07/22/2019 2:56 PM   Lucas Christensen [birth year] [id number]  Referring provider: Jerrol Banana., MD [address] [place],  [place] [address]  Chief Complaint  Patient presents with  . Elevated PSA    Urologic history: 1.  History of stone disease - status post left ureteroscopic stone removal May 2014  2.  History elevated PSA -Baseline PSA upper 1/low 2 range; intermittent elevation as high as 6.1 -No previous biopsy  3.  BPH with lower urinary tract symptoms -Status post UroLift in [place] 2018  4.  Episode acute prostatitis September 2019   HPI: 68 y.o. male presents for follow-up visit.  He saw Zara Council and April 2020 complaining of malaise, body aches, dysuria frequency and urgency typical of his recurrent bacterial prostatitis.  Urinalysis showed significant pyuria at >30 WBCs. He was started on Septra DS however his urine culture was negative and he was instructed to discontinue antibiotic therapy.  Since that visit his baseline voiding symptoms are worse than usual with complaints of intermittent frequency, urgency and dysuria.  A PSA March 2020 was stable at 2.8.  He requested another PSA which was performed June 2020 and was 4.1.  He states he has "never felt the same" since his UroLift in 2018.  He relates to recurrent episodes of bacterial prostatitis which appear to be related to increased physical activity.  He indicated a previous 4K score showed 4% probability of Gleason 7 or greater prostate cancer.  He has been reluctant to have a prostate biopsy.   PMH: Past Medical History:  Diagnosis Date  . Barrett esophagus   . BPH (benign prostatic hyperplasia)   . BPH (benign prostatic hyperplasia)   . Calculus of both kidneys   . Chronic prostatitis   . Elevated PSA   . Hiatal hernia   . Hyperlipidemia   . Prostatitis   . Testicular hypofunction     Surgical History: Past Surgical History:  Procedure Laterality Date  .  COLONOSCOPY WITH PROPOFOL N/A 09/13/2018   Procedure: COLONOSCOPY WITH PROPOFOL;  Surgeon: Lucilla Lame, MD;  Location: [facility];  Service: Endoscopy;  Laterality: N/A;  . HERNIA REPAIR    . PROSTATE SURGERY    . SHOULDER SURGERY Left     Home Medications:  Allergies as of 07/22/2019   No Known Allergies     Medication List       Accurate as of July 22, 2019  2:56 PM. If you have any questions, ask your nurse or doctor.        STOP taking these medications   sulfamethoxazole-trimethoprim 800-160 MG tablet Commonly known as: BACTRIM DS Stopped by: Abbie Sons, MD     TAKE these medications   cholecalciferol 1000 units tablet Commonly known as: VITAMIN D Take 1,000 Units by mouth daily.   CREATINE PO Take by mouth daily.   GLUCOSAMINE PO Take by mouth daily.   multivitamin tablet Take 1 tablet by mouth daily.   niacin 500 MG CR capsule Take by mouth.   Suprep Bowel Prep Kit 17.5-3.13-1.6 GM/177ML Soln Generic drug: Na Sulfate-K Sulfate-Mg Sulf See admin instructions.   tadalafil 5 MG tablet Commonly known as: CIALIS Take 1 tablet (5 mg total) by mouth daily.   vitamin C 1000 MG tablet Take by mouth.   vitamin E 1000 UNIT capsule Take by mouth.       Allergies: No Known Allergies  Family History: Family History  Problem Relation Age of Onset  .  Hypertension Mother   . Heart attack Mother   . Arthritis Father   . Diabetes Father   . Hypertension Sister   . Diabetes Maternal Grandmother     Social History:  reports that he has never smoked. He has never used smokeless tobacco. He reports that he does not drink alcohol or use drugs.  ROS: UROLOGY Frequent Urination?: Yes Hard to postpone urination?: Yes Burning/pain with urination?: No Get up at night to urinate?: Yes Leakage of urine?: Yes Urine stream starts and stops?: No Trouble starting stream?: No Do you have to strain to urinate?: No Blood in urine?: No Urinary tract  infection?: No Sexually transmitted disease?: No Injury to kidneys or bladder?: No Painful intercourse?: No Weak stream?: No Erection problems?: No Penile pain?: No  Gastrointestinal Nausea?: No Vomiting?: No Indigestion/heartburn?: No Diarrhea?: No Constipation?: No  Constitutional Fever: No Night sweats?: No Weight loss?: No Fatigue?: No  Skin Skin rash/lesions?: No Itching?: No  Eyes Blurred vision?: No Double vision?: No  Ears/Nose/Throat Sore throat?: No Sinus problems?: No  Hematologic/Lymphatic Swollen glands?: No Easy bruising?: No  Cardiovascular Leg swelling?: No Chest pain?: No  Respiratory Cough?: No Shortness of breath?: No  Endocrine Excessive thirst?: No  Musculoskeletal Back pain?: No Joint pain?: No  Neurological Headaches?: No Dizziness?: No  Psychologic Depression?: No Anxiety?: No  Physical Exam: BP 120/72 (BP Location: Left Arm, Patient Position: Sitting, Cuff Size: Normal)   Pulse 71   Ht '5\' 10"'  (1.778 m)   Wt 200 lb (90.7 kg)   BMI 28.70 kg/m   Constitutional:  Alert and oriented, No acute distress. HEENT: [place] AT, moist mucus membranes.  Trachea midline, no masses. Cardiovascular: No clubbing, cyanosis, or edema. Respiratory: Normal respiratory effort, no increased work of breathing. GI: Abdomen is soft, nontender, nondistended, no abdominal masses GU: No CVA tenderness.  Prostate 60+ cc, smooth without nodules Lymph: No cervical or inguinal lymphadenopathy. Skin: No rashes, bruises or suspicious lesions. Neurologic: Grossly intact, no focal deficits, moving all 4 extremities. Psychiatric: Normal mood and affect.  Laboratory Data:  Urinalysis Dipstick 1+ leukocytes nitrite positive Microscopy 6-10 WBC  Assessment & Plan:    - Elevated prostate specific antigen (PSA) His most recent PSA is elevated above baseline.  Urinalysis today does have pyuria and is nitrite positive.  Will plan on repeating after urine  culture results and antibiotic therapy.  If PSA remains elevated would recommend a prostate MRI.  - Recurrent bacterial prostatitis Urine results as above.  Will await culture report prior to antibiotic therapy.   Abbie Sons, [place] [address], [place],  [address] [phone]

## 2019-07-25 ENCOUNTER — Encounter: Payer: Self-pay | Admitting: Urology

## 2019-07-25 ENCOUNTER — Other Ambulatory Visit: Payer: Self-pay | Admitting: Urology

## 2019-07-25 ENCOUNTER — Telehealth: Payer: Self-pay

## 2019-07-25 LAB — CULTURE, URINE COMPREHENSIVE

## 2019-07-25 MED ORDER — DOXYCYCLINE HYCLATE 100 MG PO CAPS: 100.0000 mg | ORAL_CAPSULE | Freq: Two times a day (BID) | ORAL | 0 refills | Status: DC

## 2019-07-25 NOTE — Telephone Encounter (Signed)
-----   Message from Abbie Sons, MD sent at 07/25/2019  2:16 PM EDT ----- Urine culture grew mixed flora.  Based on symptoms and history of recurrent prostatitis antibiotic Rx doxycycline was sent to pharmacy.  He is scheduled for a follow-up testosterone level in approximately 6 weeks.

## 2019-07-25 NOTE — Telephone Encounter (Signed)
Patient notified

## 2019-08-24 ENCOUNTER — Other Ambulatory Visit: Payer: Self-pay

## 2019-08-24 DIAGNOSIS — R972 Elevated prostate specific antigen [PSA]: Secondary | ICD-10-CM

## 2019-08-25 ENCOUNTER — Other Ambulatory Visit: Payer: Medicare Other

## 2019-08-25 ENCOUNTER — Other Ambulatory Visit: Payer: Self-pay

## 2019-08-25 DIAGNOSIS — R972 Elevated prostate specific antigen [PSA]: Secondary | ICD-10-CM

## 2019-08-26 LAB — PSA: Prostate Specific Ag, Serum: 2.2 ng/mL (ref 0.0–4.0)

## 2019-08-26 LAB — TESTOSTERONE: Testosterone: 352 ng/dL (ref 264–916)

## 2019-08-29 ENCOUNTER — Encounter: Payer: Self-pay | Admitting: Family Medicine

## 2019-10-03 NOTE — Progress Notes (Signed)
Patient: Lucas Christensen, Male    DOB: [birth year], 68 y.o.   MRN: [id number] Visit Date: 10/05/2019  Today's Provider: Wilhemena Durie, MD   Chief Complaint  Patient presents with  . Annual Exam   Subjective:     Annual wellness visit Lucas Christensen is a 68 y.o. male. He feels well. He reports exercising not regularly, but he does stay active. He reports he is sleeping well for the most part.  He is retired for the most part now.  He does occasionally training exercises for long enforcement.  Colonoscopy- 09/13/2018. Hyperplastic polyps. Repeat in 10 years.  Immunization History  Administered Date(s) Administered  . Influenza, High Dose Seasonal PF 08/18/2018  . Pneumococcal Conjugate-13 08/18/2018  . Tdap 05/14/2013  . Zoster 09/07/2013     Review of Systems  Constitutional: Negative.   HENT: Negative.   Eyes: Negative.   Respiratory: Negative.   Gastrointestinal: Negative.   Endocrine: Negative.   Genitourinary: Negative.   Musculoskeletal: Negative.   Allergic/Immunologic: Negative.   Neurological: Negative.   Hematological: Negative.   Psychiatric/Behavioral: Negative.     Social History   Socioeconomic History  . Marital status: Married    Spouse name: Not on file  . Number of children: Not on file  . Years of education: Not on file  . Highest education level: Not on file  Occupational History  . Not on file  Social Needs  . Financial resource strain: Not on file  . Food insecurity    Worry: Not on file    Inability: Not on file  . Transportation needs    Medical: Not on file    Non-medical: Not on file  Tobacco Use  . Smoking status: Never Smoker  . Smokeless tobacco: Never Used  Substance and Sexual Activity  . Alcohol use: No    Comment: occasional wine on Holidays  . Drug use: No  . Sexual activity: Not on file  Lifestyle  . Physical activity    Days per week: Not on file    Minutes per session: Not on file  . Stress: Not on  file  Relationships  . Social Herbalist on phone: Not on file    Gets together: Not on file    Attends religious service: Not on file    Active member of club or organization: Not on file    Attends meetings of clubs or organizations: Not on file    Relationship status: Not on file  . Intimate partner violence    Fear of current or ex partner: Not on file    Emotionally abused: Not on file    Physically abused: Not on file    Forced sexual activity: Not on file  Other Topics Concern  . Not on file  Social History Narrative  . Not on file    Past Medical History:  Diagnosis Date  . Barrett esophagus   . BPH (benign prostatic hyperplasia)   . BPH (benign prostatic hyperplasia)   . Calculus of both kidneys   . Chronic prostatitis   . Elevated PSA   . Hiatal hernia   . Hyperlipidemia   . Prostatitis   . Testicular hypofunction      Patient Active Problem List   Diagnosis Date Noted  . Encounter for screening colonoscopy   . Hyperplasia, prostate 04/08/2016  . Acid reflux 04/08/2016  . Hypogonadism male 04/08/2016  . Benign essential HTN 03/22/2015  .  Gastroesophageal reflux disease with hiatal hernia 03/22/2015  . Combined fat and carbohydrate induced hyperlipemia 03/22/2015  . Nonspecific chest pain 03/22/2015  . Routine physical examination 05/03/2014  . Increased frequency of urination 03/14/2014  . Urinary tract infection, site not specified 03/14/2014  . Dysphagia, unspecified(787.20) 08/29/2013  . Dysphagia 08/29/2013  . Laceration of hand with tendon involvement including fingers 05/27/2013  . Gross hematuria 04/07/2013  . Kidney stone 04/07/2013  . Ureteric stone 04/07/2013  . Elevated prostate specific antigen (PSA) 08/31/2012  . 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 Incomplete emptying of bladder 08/31/2012  . Nodular prostate with urinary obstruction 08/31/2012  . Hypotestosteronism 11/21/2009  . Avitaminosis D 11/21/2009  . Internal  hemorrhoids without complication 89/38/1017  . Hernia of abdominal cavity 12/14/2007  . HLD (hyperlipidemia) 06/08/2007    Past Surgical History:  Procedure Laterality Date  . COLONOSCOPY WITH PROPOFOL N/A 09/13/2018   Procedure: COLONOSCOPY WITH PROPOFOL;  Surgeon: Lucilla Lame, MD;  Location: [facility];  Service: Endoscopy;  Laterality: N/A;  . HERNIA REPAIR    . PROSTATE SURGERY    . SHOULDER SURGERY Left     His family history includes Arthritis in his father; Diabetes in his father and maternal grandmother; Heart attack in his mother; Hypertension in his mother and sister.   Current Outpatient Medications:  .  Ascorbic Acid (VITAMIN C) 1000 MG tablet, Take by mouth., Disp: , Rfl:  .  cholecalciferol (VITAMIN D) 1000 units tablet, Take 1,000 Units by mouth daily., Disp: , Rfl:  .  CREATINE PO, Take by mouth daily., Disp: , Rfl:  .  Glucosamine HCl (GLUCOSAMINE PO), Take by mouth daily., Disp: , Rfl:  .  Multiple Vitamin (MULTIVITAMIN) tablet, Take 1 tablet by mouth daily., Disp: , Rfl:  .  niacin 500 MG CR capsule, Take by mouth., Disp: , Rfl:  .  tadalafil (CIALIS) 5 MG tablet, Take 1 tablet (5 mg total) by mouth daily., Disp: 90 tablet, Rfl: 3 .  vitamin E 1000 UNIT capsule, Take by mouth., Disp: , Rfl:  .  doxycycline (VIBRAMYCIN) 100 MG capsule, Take 1 capsule (100 mg total) by mouth 2 (two) times daily. (Patient not taking: Reported on 10/05/2019), Disp: 60 capsule, Rfl: 0 .  SUPREP BOWEL PREP KIT 17.5-3.13-1.6 GM/177ML SOLN, See admin instructions., Disp: , Rfl: 0  Patient Care Team: Jerrol Banana., MD as PCP - General (Family Medicine)    Objective:    Vitals: BP 122/68   Pulse 62   Temp 97.6 F (36.4 C)   Resp 16   Ht '5\' 10"'  (1.778 m)   Wt 219 lb (99.3 kg)   SpO2 94%   BMI 31.42 kg/m   Physical Exam Vitals signs reviewed.  Constitutional:      Appearance: He is well-developed.  HENT:     Head: Normocephalic and atraumatic.     Right Ear:  External ear normal.     Left Ear: External ear normal.     Nose: Nose normal.  Eyes:     Conjunctiva/sclera: Conjunctivae normal.     Pupils: Pupils are equal, round, and reactive to light.  Neck:     Musculoskeletal: Normal range of motion and neck supple.  Cardiovascular:     Rate and Rhythm: Normal rate and regular rhythm.     Heart sounds: Normal heart sounds.  Pulmonary:     Effort: Pulmonary effort is normal.     Breath sounds: Normal breath sounds.  Abdominal:  General: Bowel sounds are normal.     Palpations: Abdomen is soft.  Genitourinary:    Penis: Normal.      Prostate: Normal.     Rectum: Normal.  Musculoskeletal: Normal range of motion.  Skin:    General: Skin is warm and dry.  Neurological:     Mental Status: He is alert and oriented to person, place, and time.  Psychiatric:        Behavior: Behavior normal.        Thought Content: Thought content normal.        Judgment: Judgment normal.     Activities of Daily Living In your present state of health, do you have any difficulty performing the following activities: 10/05/2019  Hearing? Y  Vision? N  Difficulty concentrating or making decisions? N  Walking or climbing stairs? N  Dressing or bathing? N  Doing errands, shopping? N  Some recent data might be hidden    Fall Risk Assessment Fall Risk  10/05/2019 02/07/2019 08/18/2018 04/08/2016  Falls in the past year? 0 0 No No  Number falls in past yr: 0 - - -  Injury with Fall? 0 - - -  Follow up Falls evaluation completed - - -     Depression Screen PHQ 2/9 Scores 02/07/2019 08/18/2018 04/08/2016  PHQ - 2 Score 0 0 0    6CIT Screen 10/05/2019  What Year? 0 points  What month? 0 points  What time? 0 points  Count back from 20 0 points  Months in reverse 0 points  Repeat phrase 0 points  Total Score 0      Assessment & Plan:     Annual Physical Visit  Reviewed patient's Family Medical History Reviewed and updated list of patient's medical  providers Assessment of cognitive impairment was done Assessed patient's functional ability Established a written schedule for health screening [place] Completed and Reviewed  Exercise Activities and Dietary recommendations Goals   None     Immunization History  Administered Date(s) Administered  . Influenza, High Dose Seasonal PF 08/18/2018  . Pneumococcal Conjugate-13 08/18/2018  . Tdap 05/14/2013  . Zoster 09/07/2013    Health Maintenance  Topic Date Due  . INFLUENZA VACCINE  07/09/2019  . PNA vac Low Risk Adult (2 of 2 - PPSV23) 08/19/2019  . TETANUS/TDAP  05/15/2023  . COLONOSCOPY  09/13/2028  . Hepatitis C Screening  Completed     Discussed health benefits of physical activity, and encouraged him to engage in regular exercise appropriate for his age and condition.   1. Annual physical exam Follow-up 1 year.   Thurmon Mizell Cranford Mon, MD  [facility]

## 2019-10-05 ENCOUNTER — Ambulatory Visit (INDEPENDENT_AMBULATORY_CARE_PROVIDER_SITE_OTHER): Payer: Medicare Other | Admitting: Family Medicine

## 2019-10-05 ENCOUNTER — Other Ambulatory Visit: Payer: Self-pay

## 2019-10-05 ENCOUNTER — Encounter: Payer: Self-pay | Admitting: Family Medicine

## 2019-10-05 VITALS — BP 122/68 | HR 62 | Temp 97.6°F | Resp 16 | Ht 70.0 in | Wt 219.0 lb

## 2019-10-05 DIAGNOSIS — Z0000 Encounter for general adult medical examination without abnormal findings: Secondary | ICD-10-CM | POA: Diagnosis not present

## 2019-10-10 ENCOUNTER — Ambulatory Visit: Payer: Medicare Other | Admitting: Urology

## 2019-10-10 ENCOUNTER — Other Ambulatory Visit: Payer: Self-pay

## 2019-10-10 ENCOUNTER — Encounter: Payer: Self-pay | Admitting: Urology

## 2019-10-10 VITALS — BP 126/80 | HR 64 | Ht 70.0 in | Wt 220.0 lb

## 2019-10-10 DIAGNOSIS — N411 Chronic prostatitis: Secondary | ICD-10-CM

## 2019-10-10 DIAGNOSIS — N400 Enlarged prostate without lower urinary tract symptoms: Secondary | ICD-10-CM

## 2019-10-10 MED ORDER — TADALAFIL 5 MG PO TABS: 5.0000 mg | ORAL_TABLET | Freq: Every day | ORAL | 3 refills | Status: DC

## 2019-10-10 NOTE — Progress Notes (Signed)
10/10/2019 9:29 AM   Lucas Christensen 06-16-1951 [id number]  Referring provider: Jerrol Banana., MD [address] [place],   [address]  Chief Complaint  Patient presents with  . Elevated PSA    Urologic history: 1.History of stone disease -status post left ureteroscopic stone removal May 2014  2.History elevated PSA -Baseline PSA upper 1/low 2 range; intermittent elevation as high as 6.1 -No previous biopsy  3.BPH with lower urinary tract symptoms -Status post UroLift in [place] 2018  4.Episode acute prostatitis September 2019   HPI: 68 y.o. male presents for follow-up of prostatitis and an elevated PSA.  I last saw him August 2020.  Urinalysis had shown significant pyuria with a negative culture.  May repeat culture was still negative and he was treated with an empiric 6-week course of doxycycline and noted improvement in his voiding symptoms.  His repeat PSA also returned to baseline at 2.2.  He also feels he has benefited from daily tadalafil and has requested a refill.  Denies dysuria or gross hematuria.   PMH: Past Medical History:  Diagnosis Date  . Barrett esophagus   . BPH (benign prostatic hyperplasia)   . BPH (benign prostatic hyperplasia)   . Calculus of both kidneys   . Chronic prostatitis   . Elevated PSA   . Hiatal hernia   . Hyperlipidemia   . Prostatitis   . Testicular hypofunction     Surgical History: Past Surgical History:  Procedure Laterality Date  . COLONOSCOPY WITH PROPOFOL N/A 09/13/2018   Procedure: COLONOSCOPY WITH PROPOFOL;  Surgeon: Lucilla Lame, MD;  Location: [facility];  Service: Endoscopy;  Laterality: N/A;  . HERNIA REPAIR    . PROSTATE SURGERY    . SHOULDER SURGERY Left     Home Medications:  Allergies as of 10/10/2019      Reactions   Flomax [tamsulosin]       Medication List       Accurate as of October 10, 2019  9:29 AM. If you have any questions, ask your nurse or  doctor.        STOP taking these medications   CREATINE PO Stopped by: Abbie Sons, MD   doxycycline 100 MG capsule Commonly known as: VIBRAMYCIN Stopped by: Abbie Sons, MD   GLUCOSAMINE PO Stopped by: Abbie Sons, MD   Suprep Bowel Prep Kit 17.5-3.13-1.6 GM/177ML Soln Generic drug: Na Sulfate-K Sulfate-Mg Sulf Stopped by: Abbie Sons, MD     TAKE these medications   cholecalciferol 1000 units tablet Commonly known as: VITAMIN D Take 1,000 Units by mouth daily.   multivitamin tablet Take 1 tablet by mouth daily.   niacin 500 MG CR capsule Take by mouth.   tadalafil 5 MG tablet Commonly known as: CIALIS Take 1 tablet (5 mg total) by mouth daily.   vitamin C 1000 MG tablet Take by mouth.   vitamin E 1000 UNIT capsule Take by mouth.       Allergies:  Allergies  Allergen Reactions  . Flomax [Tamsulosin]     Family History: Family History  Problem Relation Age of Onset  . Hypertension Mother   . Heart attack Mother   . Arthritis Father   . Diabetes Father   . Hypertension Sister   . Diabetes Maternal Grandmother     Social History:  reports that he has never smoked. He has never used smokeless tobacco. He reports that he does not drink alcohol or use drugs.  ROS: UROLOGY Frequent Urination?: No Hard to postpone urination?: No Burning/pain with urination?: No Get up at night to urinate?: No Leakage of urine?: No Urine stream starts and stops?: No Trouble starting stream?: No Do you have to strain to urinate?: No Blood in urine?: No Urinary tract infection?: No Sexually transmitted disease?: No Injury to kidneys or bladder?: No Painful intercourse?: No Weak stream?: No Erection problems?: No Penile pain?: No  Gastrointestinal Nausea?: No Vomiting?: No Indigestion/heartburn?: No Diarrhea?: No Constipation?: No  Constitutional Fever: No Night sweats?: No Weight loss?: No Fatigue?: No  Skin Skin rash/lesions?: No  Itching?: No  Eyes Blurred vision?: No Double vision?: No  Ears/Nose/Throat Sore throat?: No Sinus problems?: No  Hematologic/Lymphatic Swollen glands?: No Easy bruising?: No  Cardiovascular Leg swelling?: No Chest pain?: No  Respiratory Cough?: No Shortness of breath?: No  Endocrine Excessive thirst?: No  Musculoskeletal Back pain?: No Joint pain?: No  Neurological Headaches?: No Dizziness?: No  Psychologic Depression?: No Anxiety?: No  Physical Exam: BP 126/80   Pulse 64   Ht _0  (1.778 m)   Wt 220 lb (99.8 kg)   BMI 31.57 kg/m   Constitutional:  Alert and oriented, No acute distress. HEENT: [place] AT, moist mucus membranes.  Trachea midline, no masses. Cardiovascular: No clubbing, cyanosis, or edema. Respiratory: Normal respiratory effort, no increased work of breathing.   Assessment & Plan:    - Chronic prostatitis PSA has returned to baseline and voiding pattern has improved.  He has requested to have his PSA rechecked before the end of the year and will check UA at that time.  - BPH with lower urinary tract symptoms Status post UroLift placement.  He has been unable to tolerate an alpha-blocker secondary to orthostatic changes.  He has requested to continue tadalafil 5 mg daily.  Rx sent.   Abbie Sons, [place] [address] [place], [place] [address] [phone]

## 2019-10-17 ENCOUNTER — Ambulatory Visit: Payer: Medicare Other | Admitting: Urology

## 2020-01-12 ENCOUNTER — Other Ambulatory Visit: Payer: Self-pay | Admitting: *Deleted

## 2020-01-12 ENCOUNTER — Other Ambulatory Visit: Payer: Self-pay

## 2020-01-12 ENCOUNTER — Other Ambulatory Visit: Payer: Medicare PPO

## 2020-01-12 DIAGNOSIS — R972 Elevated prostate specific antigen [PSA]: Secondary | ICD-10-CM

## 2020-01-13 LAB — PSA: Prostate Specific Ag, Serum: 4.8 ng/mL — ABNORMAL HIGH (ref 0.0–4.0)

## 2020-01-16 ENCOUNTER — Telehealth: Payer: Self-pay | Admitting: *Deleted

## 2020-01-16 ENCOUNTER — Other Ambulatory Visit: Payer: Self-pay | Admitting: *Deleted

## 2020-01-16 DIAGNOSIS — N419 Inflammatory disease of prostate, unspecified: Secondary | ICD-10-CM

## 2020-01-16 DIAGNOSIS — N411 Chronic prostatitis: Secondary | ICD-10-CM

## 2020-01-16 NOTE — Telephone Encounter (Signed)
Notified patient as instructed, patient pleased. Discussed follow-up appointments, patient agrees  

## 2020-01-16 NOTE — Telephone Encounter (Signed)
-----   Message from Abbie Sons, MD sent at 01/15/2020 11:26 AM EST ----- PSA was 4.8.  He has a history of recurrent prostatitis.  Would recommend checking a urinalysis.

## 2020-01-17 ENCOUNTER — Other Ambulatory Visit: Payer: Medicare PPO

## 2020-01-17 ENCOUNTER — Other Ambulatory Visit: Payer: Self-pay

## 2020-01-17 DIAGNOSIS — N419 Inflammatory disease of prostate, unspecified: Secondary | ICD-10-CM

## 2020-01-17 LAB — MICROSCOPIC EXAMINATION
Bacteria, UA: NONE SEEN
RBC: NONE SEEN /hpf (ref 0–2)

## 2020-01-17 LAB — URINALYSIS, COMPLETE
Bilirubin, UA: NEGATIVE
Glucose, UA: NEGATIVE
Ketones, UA: NEGATIVE
Nitrite, UA: NEGATIVE
RBC, UA: NEGATIVE
Specific Gravity, UA: >1.03 — ABNORMAL HIGH (ref 1.005–1.030)
Urobilinogen, Ur: 0.2 mg/dL (ref 0.2–1.0)
pH, UA: 5.5 (ref 5.0–7.5)

## 2020-01-18 ENCOUNTER — Telehealth: Payer: Self-pay | Admitting: *Deleted

## 2020-01-18 NOTE — Telephone Encounter (Signed)
Notified patient as instructed, patient pleased. Discussed follow-up appointments, patient agrees  

## 2020-01-18 NOTE — Telephone Encounter (Signed)
-----   Message from Abbie Sons, MD sent at 01/18/2020  8:59 AM EST ----- Urinalysis does show 11-30 WBCs.  A urine culture was ordered.  This would be the reason his PSA has increased.  Chronic prostatitis is a condition that is very difficult to treat and unfortunately there is not a cure.  Will contact when culture returns.

## 2020-01-22 ENCOUNTER — Other Ambulatory Visit: Payer: Self-pay | Admitting: Urology

## 2020-01-22 LAB — CULTURE, URINE COMPREHENSIVE

## 2020-01-22 MED ORDER — DOXYCYCLINE HYCLATE 100 MG PO CAPS: 100.0000 mg | ORAL_CAPSULE | Freq: Two times a day (BID) | ORAL | 0 refills | Status: AC

## 2020-01-23 ENCOUNTER — Telehealth: Payer: Self-pay | Admitting: *Deleted

## 2020-01-23 NOTE — Telephone Encounter (Signed)
-----   Message from Abbie Sons, MD sent at 01/22/2020 12:25 PM EST ----- Urine culture grew low level of bacteria.  Recommend a 28-day antibiotic course to adequately cover prostate.  Repeat PSA 1 month

## 2020-01-23 NOTE — Telephone Encounter (Signed)
Notified patient as instructed, patient pleased. Discussed follow-up appointments, patient agrees  

## 2020-02-13 ENCOUNTER — Other Ambulatory Visit: Payer: Self-pay | Admitting: Urology

## 2020-02-16 ENCOUNTER — Other Ambulatory Visit: Payer: Self-pay | Admitting: *Deleted

## 2020-02-16 DIAGNOSIS — R972 Elevated prostate specific antigen [PSA]: Secondary | ICD-10-CM

## 2020-02-20 ENCOUNTER — Other Ambulatory Visit: Payer: Medicare PPO

## 2020-02-20 ENCOUNTER — Other Ambulatory Visit: Payer: Self-pay

## 2020-02-20 DIAGNOSIS — R972 Elevated prostate specific antigen [PSA]: Secondary | ICD-10-CM

## 2020-02-21 LAB — PSA: Prostate Specific Ag, Serum: 2.3 ng/mL (ref 0.0–4.0)

## 2020-02-24 ENCOUNTER — Telehealth: Payer: Self-pay | Admitting: *Deleted

## 2020-02-24 NOTE — Telephone Encounter (Signed)
-----   Message from Abbie Sons, MD sent at 02/23/2020 10:41 PM EDT ----- PSA was normal.  He has a follow-up in May but does not need a PSA at this visit

## 2020-02-24 NOTE — Telephone Encounter (Signed)
Notified patient as instructed, patient pleased. Discussed follow-up appointments, patient agrees  

## 2020-04-04 ENCOUNTER — Ambulatory Visit: Payer: Self-pay | Admitting: Family Medicine

## 2020-04-11 ENCOUNTER — Ambulatory Visit: Payer: Medicare Other | Admitting: Urology

## 2020-06-08 ENCOUNTER — Telehealth: Payer: Self-pay

## 2020-06-08 DIAGNOSIS — K2100 Gastro-esophageal reflux disease with esophagitis, without bleeding: Secondary | ICD-10-CM

## 2020-06-08 NOTE — Telephone Encounter (Signed)
Copied from [place] [phone]. Topic: Referral - Request for Referral >> Jun 08, 2020  9:42 AM Rainey Pines A wrote: Has patient seen PCP for this complaint? yes *If NO, is insurance requiring patient see PCP for this issue before PCP can refer them? Referral for which specialty: gastroentology Preferred provider/office:Dr.Wohl [phone] Reason for referral: Patient is checking his esophagus every  3 years and would like referral placed as soon as possible

## 2020-06-15 NOTE — Telephone Encounter (Signed)
Okay to refer? 

## 2020-06-18 NOTE — Telephone Encounter (Signed)
Referral was placed 06/15/2020.

## 2020-07-31 ENCOUNTER — Telehealth: Payer: Self-pay | Admitting: Family Medicine

## 2020-07-31 NOTE — Telephone Encounter (Signed)
Copied from [place] [id number]. Topic: Medicare AWV >> Jul 31, 2020 11:51 AM Cher Nakai R wrote: Reason for CRM:  Left message for patient to call back and schedule Medicare Annual Wellness Visit (AWV) either virtually or in office.  Last AWV 08/18/2018  Please schedule at anytime with [facility] Health Advisor.  If any questions, please contact me at [phone]

## 2020-08-08 ENCOUNTER — Encounter: Payer: Self-pay | Admitting: Gastroenterology

## 2020-08-08 ENCOUNTER — Ambulatory Visit (INDEPENDENT_AMBULATORY_CARE_PROVIDER_SITE_OTHER): Payer: Medicare PPO | Admitting: Gastroenterology

## 2020-08-08 ENCOUNTER — Other Ambulatory Visit: Payer: Self-pay

## 2020-08-08 VITALS — BP 114/72 | HR 75 | Temp 97.3°F | Ht 70.0 in | Wt 210.6 lb

## 2020-08-08 DIAGNOSIS — K22719 Barrett's esophagus with dysplasia, unspecified: Secondary | ICD-10-CM | POA: Diagnosis not present

## 2020-08-08 DIAGNOSIS — K2100 Gastro-esophageal reflux disease with esophagitis, without bleeding: Secondary | ICD-10-CM

## 2020-08-08 DIAGNOSIS — K219 Gastro-esophageal reflux disease without esophagitis: Secondary | ICD-10-CM | POA: Diagnosis not present

## 2020-08-08 NOTE — Progress Notes (Signed)
Primary Care Physician: Jerrol Banana., MD  Primary Gastroenterologist:  Dr. Lucilla Lame  Chief Complaint  Patient presents with  . New Patient (Initial Visit)    GERD    HPI: Lucas Christensen is a 69 y.o. male here due to history of Barrett's esophagus and heartburn.The patient had a colonoscopy by me in 2019 that time that colonoscopy was normal.  The patient had an upper endoscopy in February 2018 at [facility] outpatient clinic in [place] with a recommendation of a repeat EGD in 3 months.  The patient had a repeat EGD in May 2018.  The patient's recent for the EGDs was active esophagitis with Barrett's esophagus.  He reports that he stopped his proton pump inhibitor because after his first procedure he was told he had a small amount of Barrett's and after taking the medication and having a repeat procedure he states that the Barrett's has gotten bigger so therefore he felt that the PPI was not working.  He denies any dysphagia and reports that he is actually heavier now than he has been in the past.  There is no report of any black stools or bloody stools.  He also denies any overt heartburn.  Past Medical History:  Diagnosis Date  . Barrett esophagus   . BPH (benign prostatic hyperplasia)   . BPH (benign prostatic hyperplasia)   . Calculus of both kidneys   . Chronic prostatitis   . Elevated PSA   . Hiatal hernia   . Hyperlipidemia   . Prostatitis   . Testicular hypofunction     Current Outpatient Medications  Medication Sig Dispense Refill  . Ascorbic Acid (VITAMIN C) 1000 MG tablet Take by mouth.    . cholecalciferol (VITAMIN D) 1000 units tablet Take 1,000 Units by mouth daily.    . Multiple Vitamin (MULTIVITAMIN) tablet Take 1 tablet by mouth daily.    . niacin 500 MG CR capsule Take by mouth.    . permethrin (ELIMITE) 5 % cream Apply topically daily.    . tadalafil (CIALIS) 5 MG tablet Take 1 tablet (5 mg total) by mouth daily. 90 tablet 3  .  triamcinolone (KENALOG) 0.025 % cream Apply 1 application topically 2 (two) times daily.    . vitamin E 1000 UNIT capsule Take by mouth.     No current facility-administered medications for this visit.    Allergies as of 08/08/2020 - Review Complete 08/08/2020  Allergen Reaction Noted  . Flomax [tamsulosin]  10/10/2019    ROS:  General: Negative for anorexia, weight loss, fever, chills, fatigue, weakness. ENT: Negative for hoarseness, difficulty swallowing , nasal congestion. CV: Negative for chest pain, angina, palpitations, dyspnea on exertion, peripheral edema.  Respiratory: Negative for dyspnea at rest, dyspnea on exertion, cough, sputum, wheezing.  GI: See history of present illness. GU:  Negative for dysuria, hematuria, urinary incontinence, urinary frequency, nocturnal urination.  Endo: Negative for unusual weight change.    Physical Examination:   BP 114/72   Pulse 75   Temp (!) 97.3 F (36.3 C) (Temporal)   Ht 5\' 10"  (1.778 m)   Wt 210 lb 9.6 oz (95.5 kg)   BMI 30.22 kg/m   General: Well-nourished, well-developed in no acute distress.  Eyes: No icterus. Conjunctivae pink. Lungs: Clear to auscultation bilaterally. Non-labored. Heart: Regular rate and rhythm, no murmurs rubs or gallops.  Abdomen: Bowel sounds are normal, nontender, nondistended, no hepatosplenomegaly or masses, no abdominal bruits or hernia , no rebound  or guarding.   Extremities: No lower extremity edema. No clubbing or deformities. Neuro: Alert and oriented x 3.  Grossly intact. Skin: Warm and dry, no jaundice.   Psych: Alert and cooperative, normal mood and affect.  Labs:    Imaging Studies: No results found.  Assessment and Plan:   Lucas Christensen is a 69 y.o. y/o male who has a history of Barrett's esophagus and has not been on any medication for this because he did not feel that it was working. The patient is due for repeat upper endoscopy and is considering whether or not he needs to go  back on a PPI or whether he should consider having antireflux surgery.  The patient will be set up for an EGD with biopsies the esophagus.  The patient has been explained the plan and agrees with it.     Lucilla Lame, MD. Marval Regal    Note: This dictation was prepared with Dragon dictation along with smaller phrase technology. Any transcriptional errors that result from this process are unintentional.

## 2020-08-08 NOTE — H&P (View-Only) (Signed)
Primary Care Physician: Jerrol Banana., MD  Primary Gastroenterologist:  Dr. Lucilla Lame  Chief Complaint  Patient presents with  . New Patient (Initial Visit)    GERD    HPI: Lucas Christensen is a 69 y.o. male here due to history of Barrett's esophagus and heartburn.The patient had a colonoscopy by me in 2019 that time that colonoscopy was normal.  The patient had an upper endoscopy in February 2018 at [facility] outpatient clinic in [place] with a recommendation of a repeat EGD in 3 months.  The patient had a repeat EGD in May 2018.  The patient's recent for the EGDs was active esophagitis with Barrett's esophagus.  He reports that he stopped his proton pump inhibitor because after his first procedure he was told he had a small amount of Barrett's and after taking the medication and having a repeat procedure he states that the Barrett's has gotten bigger so therefore he felt that the PPI was not working.  He denies any dysphagia and reports that he is actually heavier now than he has been in the past.  There is no report of any black stools or bloody stools.  He also denies any overt heartburn.  Past Medical History:  Diagnosis Date  . Barrett esophagus   . BPH (benign prostatic hyperplasia)   . BPH (benign prostatic hyperplasia)   . Calculus of both kidneys   . Chronic prostatitis   . Elevated PSA   . Hiatal hernia   . Hyperlipidemia   . Prostatitis   . Testicular hypofunction     Current Outpatient Medications  Medication Sig Dispense Refill  . Ascorbic Acid (VITAMIN C) 1000 MG tablet Take by mouth.    . cholecalciferol (VITAMIN D) 1000 units tablet Take 1,000 Units by mouth daily.    . Multiple Vitamin (MULTIVITAMIN) tablet Take 1 tablet by mouth daily.    . niacin 500 MG CR capsule Take by mouth.    . permethrin (ELIMITE) 5 % cream Apply topically daily.    . tadalafil (CIALIS) 5 MG tablet Take 1 tablet (5 mg total) by mouth daily. 90 tablet 3  .  triamcinolone (KENALOG) 0.025 % cream Apply 1 application topically 2 (two) times daily.    . vitamin E 1000 UNIT capsule Take by mouth.     No current facility-administered medications for this visit.    Allergies as of 08/08/2020 - Review Complete 08/08/2020  Allergen Reaction Noted  . Flomax [tamsulosin]  10/10/2019    ROS:  General: Negative for anorexia, weight loss, fever, chills, fatigue, weakness. ENT: Negative for hoarseness, difficulty swallowing , nasal congestion. CV: Negative for chest pain, angina, palpitations, dyspnea on exertion, peripheral edema.  Respiratory: Negative for dyspnea at rest, dyspnea on exertion, cough, sputum, wheezing.  GI: See history of present illness. GU:  Negative for dysuria, hematuria, urinary incontinence, urinary frequency, nocturnal urination.  Endo: Negative for unusual weight change.    Physical Examination:   BP 114/72   Pulse 75   Temp (!) 97.3 F (36.3 C) (Temporal)   Ht 5\' 10"  (1.778 m)   Wt 210 lb 9.6 oz (95.5 kg)   BMI 30.22 kg/m   General: Well-nourished, well-developed in no acute distress.  Eyes: No icterus. Conjunctivae pink. Lungs: Clear to auscultation bilaterally. Non-labored. Heart: Regular rate and rhythm, no murmurs rubs or gallops.  Abdomen: Bowel sounds are normal, nontender, nondistended, no hepatosplenomegaly or masses, no abdominal bruits or hernia , no rebound  or guarding.   Extremities: No lower extremity edema. No clubbing or deformities. Neuro: Alert and oriented x 3.  Grossly intact. Skin: Warm and dry, no jaundice.   Psych: Alert and cooperative, normal mood and affect.  Labs:    Imaging Studies: No results found.  Assessment and Plan:   Lucas Christensen is a 69 y.o. y/o male who has a history of Barrett's esophagus and has not been on any medication for this because he did not feel that it was working. The patient is due for repeat upper endoscopy and is considering whether or not he needs to go  back on a PPI or whether he should consider having antireflux surgery.  The patient will be set up for an EGD with biopsies the esophagus.  The patient has been explained the plan and agrees with it.     Lucilla Lame, MD. Marval Regal    Note: This dictation was prepared with Dragon dictation along with smaller phrase technology. Any transcriptional errors that result from this process are unintentional.

## 2020-08-10 ENCOUNTER — Encounter: Payer: Self-pay | Admitting: Gastroenterology

## 2020-08-14 ENCOUNTER — Other Ambulatory Visit: Payer: Self-pay

## 2020-08-14 ENCOUNTER — Other Ambulatory Visit
Admission: RE | Admit: 2020-08-14 | Discharge: 2020-08-14 | Disposition: A | Discharge status: Home / Self Care | Payer: Medicare PPO | Source: Ambulatory Visit | Attending: Gastroenterology | Admitting: Gastroenterology

## 2020-08-14 DIAGNOSIS — Z20822 Contact with and (suspected) exposure to covid-19: Secondary | ICD-10-CM | POA: Diagnosis not present

## 2020-08-14 DIAGNOSIS — Z01812 Encounter for preprocedural laboratory examination: Secondary | ICD-10-CM | POA: Insufficient documentation

## 2020-08-15 ENCOUNTER — Other Ambulatory Visit: Payer: Medicare PPO

## 2020-08-15 LAB — SARS CORONAVIRUS 2 (TAT 6-24 HRS): SARS Coronavirus 2: NEGATIVE

## 2020-08-15 NOTE — Discharge Instructions (Signed)
General Anesthesia, Adult, Care After This sheet gives you information about how to care for yourself after your procedure. Your health care provider may also give you more specific instructions. If you have problems or questions, contact your health care provider. What can I expect after the procedure? After the procedure, the following side effects are common:  Pain or discomfort at the IV site.  Nausea.  Vomiting.  Sore throat.  Trouble concentrating.  Feeling cold or chills.  Weak or tired.  Sleepiness and fatigue.  Soreness and body aches. These side effects can affect parts of the body that were not involved in surgery. Follow these instructions at home:  For at least 24 hours after the procedure:  Have a responsible adult stay with you. It is important to have someone help care for you until you are awake and alert.  Rest as needed.  Do not: ? Participate in activities in which you could fall or become injured. ? Drive. ? Use heavy machinery. ? Drink alcohol. ? Take sleeping pills or medicines that cause drowsiness. ? Make important decisions or sign legal documents. ? Take care of children on your own. Eating and drinking  Follow any instructions from your health care provider about eating or drinking restrictions.  When you feel hungry, start by eating small amounts of foods that are soft and easy to digest (bland), such as toast. Gradually return to your regular diet.  Drink enough fluid to keep your urine pale yellow.  If you vomit, rehydrate by drinking water, juice, or clear broth. General instructions  If you have sleep apnea, surgery and certain medicines can increase your risk for breathing problems. Follow instructions from your health care provider about wearing your sleep device: ? Anytime you are sleeping, including during daytime naps. ? While taking prescription pain medicines, sleeping medicines, or medicines that make you drowsy.  Return to  your normal activities as told by your health care provider. Ask your health care provider what activities are safe for you.  Take over-the-counter and prescription medicines only as told by your health care provider.  If you smoke, do not smoke without supervision.  Keep all follow-up visits as told by your health care provider. This is important. Contact a health care provider if:  You have nausea or vomiting that does not get better with medicine.  You cannot eat or drink without vomiting.  You have pain that does not get better with medicine.  You are unable to pass urine.  You develop a skin rash.  You have a fever.  You have redness around your IV site that gets worse. Get help right away if:  You have difficulty breathing.  You have chest pain.  You have blood in your urine or stool, or you vomit blood. Summary  After the procedure, it is common to have a sore throat or nausea. It is also common to feel tired.  Have a responsible adult stay with you for the first 24 hours after general anesthesia. It is important to have someone help care for you until you are awake and alert.  When you feel hungry, start by eating small amounts of foods that are soft and easy to digest (bland), such as toast. Gradually return to your regular diet.  Drink enough fluid to keep your urine pale yellow.  Return to your normal activities as told by your health care provider. Ask your health care provider what activities are safe for you. This information is not   intended to replace advice given to you by your health care provider. Make sure you discuss any questions you have with your health care provider. Document Revised: 11/27/2017 Document Reviewed: 07/10/2017 Elsevier Patient Education  2020 [employer] Inc[employer]  

## 2020-08-16 ENCOUNTER — Ambulatory Visit: Payer: Medicare PPO | Admitting: Anesthesiology

## 2020-08-16 ENCOUNTER — Encounter
Admission: RE | Disposition: A | Discharge status: Home / Self Care | Payer: Self-pay | Source: Home / Self Care | Attending: Gastroenterology

## 2020-08-16 ENCOUNTER — Encounter: Payer: Self-pay | Admitting: Gastroenterology

## 2020-08-16 ENCOUNTER — Other Ambulatory Visit: Payer: Self-pay

## 2020-08-16 ENCOUNTER — Ambulatory Visit
Admission: RE | Admit: 2020-08-16 | Discharge: 2020-08-16 | Disposition: A | Discharge status: Home / Self Care | Payer: Medicare PPO | Attending: Gastroenterology | Admitting: Gastroenterology

## 2020-08-16 DIAGNOSIS — N400 Enlarged prostate without lower urinary tract symptoms: Secondary | ICD-10-CM | POA: Diagnosis not present

## 2020-08-16 DIAGNOSIS — Z8719 Personal history of other diseases of the digestive system: Secondary | ICD-10-CM | POA: Insufficient documentation

## 2020-08-16 DIAGNOSIS — Z79899 Other long term (current) drug therapy: Secondary | ICD-10-CM | POA: Diagnosis not present

## 2020-08-16 DIAGNOSIS — K2270 Barrett's esophagus without dysplasia: Secondary | ICD-10-CM | POA: Diagnosis present

## 2020-08-16 DIAGNOSIS — I10 Essential (primary) hypertension: Secondary | ICD-10-CM | POA: Insufficient documentation

## 2020-08-16 HISTORY — DX: Unspecified osteoarthritis, unspecified site: M19.90

## 2020-08-16 HISTORY — DX: Presence of spectacles and contact lenses: Z97.3

## 2020-08-16 HISTORY — DX: Gastro-esophageal reflux disease without esophagitis: K21.9

## 2020-08-16 HISTORY — PX: ESOPHAGOGASTRODUODENOSCOPY (EGD) WITH PROPOFOL: SHX5813

## 2020-08-16 SURGERY — ESOPHAGOGASTRODUODENOSCOPY (EGD) WITH PROPOFOL
Anesthesia: General | Site: Throat

## 2020-08-16 MED ORDER — GLYCOPYRROLATE 0.2 MG/ML IJ SOLN
INTRAMUSCULAR | Status: DC | PRN
  Administered 2020-08-16: .2 mg via INTRAVENOUS

## 2020-08-16 MED ORDER — OXYCODONE HCL 5 MG PO TABS: 5.0000 mg | ORAL_TABLET | Freq: Once | ORAL | Status: DC | PRN

## 2020-08-16 MED ORDER — LIDOCAINE HCL (CARDIAC) PF 100 MG/5ML IV SOSY
PREFILLED_SYRINGE | INTRAVENOUS | Status: DC | PRN
  Administered 2020-08-16: 60 mg via INTRAVENOUS

## 2020-08-16 MED ORDER — LACTATED RINGERS IV SOLN: INTRAVENOUS | Status: DC

## 2020-08-16 MED ORDER — OXYCODONE HCL 5 MG/5ML PO SOLN: 5.0000 mg | Freq: Once | ORAL | Status: DC | PRN

## 2020-08-16 MED ORDER — PANTOPRAZOLE SODIUM 40 MG PO TBEC: 40.0000 mg | DELAYED_RELEASE_TABLET | Freq: Every day | ORAL | 11 refills | Status: DC

## 2020-08-16 MED ORDER — PROPOFOL 10 MG/ML IV BOLUS
INTRAVENOUS | Status: DC | PRN
  Administered 2020-08-16: 50 mg via INTRAVENOUS
  Administered 2020-08-16: 70 mg via INTRAVENOUS
  Administered 2020-08-16: 20 mg via INTRAVENOUS

## 2020-08-16 SURGICAL SUPPLY — 7 items
BLOCK BITE 60FR ADLT L/F GRN (MISCELLANEOUS) ×3 IMPLANT
FORCEPS BIOP RAD 4 LRG CAP 4 (CUTTING FORCEPS) ×3 IMPLANT
GOWN CVR UNV OPN BCK APRN NK (MISCELLANEOUS) ×2 IMPLANT
GOWN ISOL THUMB LOOP REG UNIV (MISCELLANEOUS) ×6
KIT ENDO PROCEDURE OLY (KITS) ×3 IMPLANT
MANIFOLD NEPTUNE II (INSTRUMENTS) ×3 IMPLANT
WATER STERILE IRR 250ML POUR (IV SOLUTION) ×3 IMPLANT

## 2020-08-16 NOTE — Anesthesia Postprocedure Evaluation (Signed)
Anesthesia Post Note  Patient: Lucas Christensen  Procedure(s) Performed: ESOPHAGOGASTRODUODENOSCOPY (EGD) WITH PROPOFOL (N/A Throat)     Patient location during evaluation: PACU Anesthesia Type: General Level of consciousness: awake and alert Pain management: pain level controlled Vital Signs Assessment: post-procedure vital signs reviewed and stable Respiratory status: spontaneous breathing Cardiovascular status: stable Postop Assessment: no headache Anesthetic complications: no   No complications documented.  Gillian Scarce

## 2020-08-16 NOTE — Op Note (Addendum)
[facility] Gastroenterology Patient Name: Lucas Christensen Procedure Date: 08/16/2020 10:17 AM MRN: [id number] Account #: [id number] Date of Birth: 09-05-1951 Admit Type: Outpatient Age: 69 Room: [facility] OR ROOM 01 Gender: Male Note Status: Finalized Procedure:             Upper GI endoscopy Indications:           Barrett's esophagus, Surveillance for malignancy due                         to personal history of Barrett's esophagus Providers:             Lucilla Lame MD, MD Referring MD:          Janine Ores. Rosanna Randy, MD (Referring MD) Medicines:             Propofol per Anesthesia Complications:         No immediate complications. Procedure:             Pre-Anesthesia Assessment:                        - Prior to the procedure, a History and Physical was                         performed, and patient medications and allergies were                         reviewed. The patient's tolerance of previous                         anesthesia was also reviewed. The risks and benefits                         of the procedure and the sedation options and risks                         were discussed with the patient. All questions were                         answered, and informed consent was obtained. Prior                         Anticoagulants: The patient has taken no previous                         anticoagulant or antiplatelet agents. ASA Grade                         Assessment: II - A patient with mild systemic disease.                         After reviewing the risks and benefits, the patient                         was deemed in satisfactory condition to undergo the                         procedure.  After obtaining informed consent, the endoscope was                         passed under direct vision. Throughout the procedure,                         the patient's blood pressure, pulse, and oxygen                         saturations were monitored  continuously. The                         Endosonoscope was introduced through the mouth, and                         advanced to the second part of duodenum. The upper GI                         endoscopy was accomplished without difficulty. The                         patient tolerated the procedure well. Findings:      The esophagus and gastroesophageal junction were examined with white       light and narrow band imaging (NBI). There were esophageal mucosal       changes consistent with long-segment Barrett's esophagus. These changes       involved the mucosa extending to an irregular Z-line (22 cm from the       incisors). Circumferential salmon-colored mucosa was present from 22 to       39 cm. The maximum longitudinal extent of these esophageal mucosal       changes was 17 cm in length. Mucosa was biopsied with a cold forceps for       histology in 4 quadrants at intervals of 2 cm. A total of 10 specimen       bottles were sent to pathology.      The stomach was normal.      The examined duodenum was normal. Impression:            - Esophageal mucosal changes consistent with                         long-segment Barrett's esophagus. Biopsied.                        - Normal stomach.                        - Normal examined duodenum. Recommendation:        - Discharge patient to home.                        - Resume previous diet.                        - Continue present medications.                        - Await pathology results.                        -  Repeat upper endoscopy in 3 years for surveillance. Lucilla Lame MD, MD 08/16/2020 10:53:31 AM This report has been signed electronically. Number of Addenda: 0 Note Initiated On: 08/16/2020 10:17 AM Total Procedure Duration: 0 hours 16 minutes 15 seconds  Estimated Blood Loss:  Estimated blood loss: none.      [facility]

## 2020-08-16 NOTE — Anesthesia Procedure Notes (Signed)
Procedure Name: MAC Date/Time: 08/16/2020 10:29 AM Performed by: Silvana Newness, CRNA Pre-anesthesia Checklist: Patient identified, Emergency Drugs available, Suction available, Patient being monitored and Timeout performed Patient Re-evaluated:Patient Re-evaluated prior to induction Oxygen Delivery Method: Nasal cannula Induction Type: IV induction Placement Confirmation: positive ETCO2

## 2020-08-16 NOTE — Interval H&P Note (Signed)
History and Physical Interval Note:  08/16/2020 9:54 AM  Lucas Christensen  has presented today for surgery, with the diagnosis of GERD K21.9.  The various methods of treatment have been discussed with the patient and family. After consideration of risks, benefits and other options for treatment, the patient has consented to  Procedure(s): ESOPHAGOGASTRODUODENOSCOPY (EGD) WITH PROPOFOL (N/A) as a surgical intervention.  The patient's history has been reviewed, patient examined, no change in status, stable for surgery.  I have reviewed the patient's chart and labs.  Questions were answered to the patient's satisfaction.     Lucas Christensen [employer]

## 2020-08-16 NOTE — Anesthesia Preprocedure Evaluation (Signed)
Anesthesia Evaluation  Patient identified by MRN, date of birth, ID band Patient awake    Reviewed: Allergy & Precautions, H&P , NPO status , Patient's Chart, lab work & pertinent test results  Airway Mallampati: II  TM Distance: >3 FB Neck ROM: full    Dental no notable dental hx.    Pulmonary neg pulmonary ROS,    Pulmonary exam normal        Cardiovascular hypertension, Normal cardiovascular exam Rhythm:regular Rate:Normal     Neuro/Psych  Neuromuscular disease    GI/Hepatic Neg liver ROS, hiatal hernia, GERD  Medicated,  Endo/Other  negative endocrine ROS  Renal/GU   negative genitourinary   Musculoskeletal   Abdominal   Peds  Hematology negative hematology ROS (+)   Anesthesia Other Findings   Reproductive/Obstetrics negative OB ROS                             Anesthesia Physical Anesthesia Plan  ASA: II  Anesthesia Plan: General   Post-op Pain Management:    Induction:   PONV Risk Score and Plan:   Airway Management Planned:   Additional Equipment:   Intra-op Plan:   Post-operative Plan:   Informed Consent: I have reviewed the patients History and Physical, chart, labs and discussed the procedure including the risks, benefits and alternatives for the proposed anesthesia with the patient or authorized representative who has indicated his/her understanding and acceptance.       Plan Discussed with:   Anesthesia Plan Comments:         Anesthesia Quick Evaluation

## 2020-08-16 NOTE — Transfer of Care (Signed)
Immediate Anesthesia Transfer of Care Note  Patient: Lucas Christensen  Procedure(s) Performed: ESOPHAGOGASTRODUODENOSCOPY (EGD) WITH PROPOFOL (N/A Throat)  Patient Location: PACU  Anesthesia Type: General  Level of Consciousness: awake, alert  and patient cooperative  Airway and Oxygen Therapy: Patient Spontanous Breathing and Patient connected to supplemental oxygen  Post-op Assessment: Post-op Vital signs reviewed, Patient's Cardiovascular Status Stable, Respiratory Function Stable, Patent Airway and No signs of Nausea or vomiting  Post-op Vital Signs: Reviewed and stable  Complications: No complications documented.

## 2020-08-17 ENCOUNTER — Encounter: Payer: Self-pay | Admitting: Gastroenterology

## 2020-08-17 LAB — SURGICAL PATHOLOGY

## 2020-08-21 ENCOUNTER — Other Ambulatory Visit: Payer: Self-pay

## 2020-08-21 DIAGNOSIS — K449 Diaphragmatic hernia without obstruction or gangrene: Secondary | ICD-10-CM

## 2020-08-22 ENCOUNTER — Telehealth: Payer: Self-pay

## 2020-08-22 NOTE — Telephone Encounter (Signed)
-----   Message from Lucilla Lame, MD sent at 08/20/2020  7:54 AM EDT ----- This patient should be seen at [facility] for possible barrett's ablation.

## 2020-08-22 NOTE — Telephone Encounter (Signed)
To notified of results and referral has been sent to [facility] for consultation for possible ablation.

## 2020-08-27 ENCOUNTER — Ambulatory Visit (INDEPENDENT_AMBULATORY_CARE_PROVIDER_SITE_OTHER): Payer: Medicare PPO | Admitting: Surgery

## 2020-08-27 ENCOUNTER — Encounter: Payer: Self-pay | Admitting: Surgery

## 2020-08-27 ENCOUNTER — Other Ambulatory Visit: Payer: Self-pay

## 2020-08-27 VITALS — BP 122/78 | HR 70 | Temp 98.3°F | Ht 70.0 in | Wt 215.0 lb

## 2020-08-27 DIAGNOSIS — K2270 Barrett's esophagus without dysplasia: Secondary | ICD-10-CM

## 2020-08-27 DIAGNOSIS — K449 Diaphragmatic hernia without obstruction or gangrene: Secondary | ICD-10-CM | POA: Diagnosis not present

## 2020-08-27 DIAGNOSIS — K219 Gastro-esophageal reflux disease without esophagitis: Secondary | ICD-10-CM

## 2020-08-27 NOTE — Patient Instructions (Addendum)
We will get you set up for a CT of the Chest and abdomen with contrast.   We will get you set up for a Barium Swallow.  We will speak with Dr Allen Norris about the next steps.   We will have you follow up here after these studies are completed.    You are scheduled for a CT of the Chest and abdomen with contrast at [facility] entrance) on Tuesday 09/11/20 at 9:00 am. You will need to arrive there by 8:30 am and have nothing to eat or drink for 4 hours prior. You will need to pick up a prep kit for this.   You are scheduled for a Barium Swallow at [facility] [facility]) on Monday 09/03/20 at 8:00 am. You will need to arrive there at 7:30 am and have nothing to eat or drink for 3 hours prior.       Laparoscopic Nissen Fundoplication  Laparoscopic Nissen fundoplication is a surgery to relieve heartburn and other problems caused by gastric fluids flowing up into your esophagus. The esophagus is the part of the body that moves food from your mouth to your stomach. Normally, the muscle that sits between your stomach and your esophagus (lower esophageal sphincter, LES) keeps stomach fluids in your stomach. In some people, the LES does not work properly, and stomach fluids flow up into the esophagus. This can happen when part of the stomach bulges through the LES (hiatal hernia). The backward flow of stomach fluids can cause a type of severe and long-lasting heartburn that is called gastroesophageal reflux disease (GERD). You may need this surgery if other treatments for GERD have not helped. Tell a health care provider about:  Any allergies you have.  All medicines you are taking, including vitamins, herbs, eye drops, creams, and over-the-counter medicines.  Any problems you or family members have had with anesthetic medicines.  Any blood disorders you have.  Any surgeries you have had.  Any medical conditions you have.  Whether you are pregnant or may be pregnant. What are the  risks? Generally, this is a safe procedure. However, problems may occur, including:  Infection.  Bleeding.  Damage to other structures or organs. This can include damage to the lung, causing a collapsed lung.  Trouble swallowing (dysphagia).  Blood clots. What happens before the procedure? Medicines  Ask your health care provider about: ? Changing or stopping your regular medicines. This is especially important if you are taking diabetes medicines or blood thinners. ? Taking medicines such as aspirin and ibuprofen. These medicines can thin your blood. Do not take these medicines unless your health care provider tells you to take them. ? Taking over-the-counter medicines, vitamins, herbs, and supplements. Staying hydrated Follow instructions from your health care provider about hydration, which may include:  Up to 2 hours before the procedure - you may continue to drink clear liquids, such as water, clear fruit juice, black coffee, and plain tea. General instructions  Plan to have someone take you home from the hospital or clinic.  Ask your health care provider what steps will be taken to help prevent infection. These may include: ? Removing hair at the surgery site. ? Washing skin with a germ-killing soap. What happens during the procedure?  An IV will be inserted into one of your veins.  You will be given a medicine to make you fall asleep (general anesthetic).  The surgeon will make a small incision in your abdomen and insert a tube through the  incision.  Your abdomen will be filled with a gas. This helps the surgeon see your organs better, and it makes more space to work.  The surgeon will insert a thin, lighted tube (laparoscope) through the small incision. This allows your surgeon to see into your abdomen.  The surgeon will make several other small incisions in your abdomen to insert the other instruments that are needed during the procedure.  Another instrument  (dilator) will be passed through your mouth and down your esophagus into the upper part of your stomach. The dilator will prevent your LES from being closed too tightly during surgery.  The upper part of your stomach will be wrapped around the lower part of your esophagus and will be stitched into place. This will strengthen the lower esophageal sphincter and prevent reflux.  If you have a hiatal hernia, it will be repaired.  The gas will be released from your abdomen.  All instruments will be removed, and the incisions will be closed with stitches (sutures).  A bandage (dressing) will be placed on your skin over the incisions. The procedure may vary among health care providers and hospitals. What happens after the procedure?  Your blood pressure, heart rate, breathing rate, and blood oxygen level will be monitored until you leave the hospital or clinic.  You will be given pain medicine as needed.  Your IV will be kept in until you are able to drink fluids.  You will be encouraged to get up and walk around as soon as possible. Summary  Laparoscopic Nissen fundoplication is a surgery to relieve heartburn and other problems caused by gastric fluids flowing up into your esophagus.  You may need this surgery if other treatments for GERD have not helped.  Follow instructions from your health care provider about eating and drinking before the procedure.  Your surgeon will use a thin, lighted tube (laparoscope) that is inserted through a small incision, allowing the surgeon to see into your abdomen. This information is not intended to replace advice given to you by your health care provider. Make sure you discuss any questions you have with your health care provider. Document Revised: 01/29/2018 Document Reviewed: 12/23/2017 Elsevier Patient Education  2020 [employer].

## 2020-08-29 ENCOUNTER — Encounter: Payer: Self-pay | Admitting: Surgery

## 2020-08-29 NOTE — Progress Notes (Signed)
Surgical Consultation  08/29/2020  Lucas Christensen is an 69 y.o. male.   Chief Complaint  Patient presents with  . New Patient (Initial Visit)    Hiatal Hernia     HPI: seen in consultation at the request of Dr. Allen Norris for hiatal hernia and reflux.  He does have a longstanding history of Barrett's esophagus.  Does report some intermittent reflux worsening when he lays on his back.  No dysphagia.  Most recently underwent an EGD by Dr. Allen Norris doing a long segment Barrett's without dysplasia.  Please note that I personally reviewed the images.  He does have a pending referral for ablation at [facility] for his Barrett's.  He did have barium swallow several years ago and have personally reviewed the images showing narrowing distal esophagus as well a  hiatal hernia with reflux.  He is able to perform more than 4 METS of activity without shortness of breath or chest pain.  Couple years ago he ran a marathon.  He does have a history of left inguinal hernia repair several years ago.  BC and CMP were reviewed and were completely normal.  Past Medical History:  Diagnosis Date  . Arthritis    left shoulder  . Barrett esophagus   . BPH (benign prostatic hyperplasia)   . Calculus of both kidneys   . Chronic prostatitis   . Elevated PSA   . GERD (gastroesophageal reflux disease)   . Hiatal hernia   . Hyperlipidemia   . Prostatitis   . Testicular hypofunction   . Wears glasses     Past Surgical History:  Procedure Laterality Date  . COLONOSCOPY WITH PROPOFOL N/A 09/13/2018   Procedure: COLONOSCOPY WITH PROPOFOL;  Surgeon: Lucilla Lame, MD;  Location: [facility];  Service: Endoscopy;  Laterality: N/A;  . ESOPHAGOGASTRODUODENOSCOPY (EGD) WITH PROPOFOL N/A 08/16/2020   Procedure: ESOPHAGOGASTRODUODENOSCOPY (EGD) WITH PROPOFOL;  Surgeon: Lucilla Lame, MD;  Location: [facility];  Service: Endoscopy;  Laterality: N/A;  . HERNIA REPAIR Left 2005   left inguinal  . PROSTATE SURGERY    .  SHOULDER SURGERY Left     Family History  Problem Relation Age of Onset  . Hypertension Mother   . Heart attack Mother   . Lung cancer Mother   . Arthritis Father   . Diabetes Father   . Hypertension Sister   . Multiple myeloma Sister   . Diabetes Maternal Grandmother     Social History:  reports that he has never smoked. He has never used smokeless tobacco. He reports that he does not drink alcohol and does not use drugs.  Allergies:  Allergies  Allergen Reactions  . Flomax [Tamsulosin]     Dizzy     Medications reviewed.  ROS Full ROS performed and is otherwise negative other than what is stated in the HPI    BP 122/78   Pulse 70   Temp 98.3 F (36.8 C)   Ht '5\' 10"'  (1.778 m)   Wt 215 lb (97.5 kg)   SpO2 98%   BMI 30.85 kg/m   Physical Exam Vitals and nursing note reviewed. Exam conducted with a chaperone present.  Constitutional:      General: He is not in acute distress.    Appearance: Normal appearance. He is normal weight. He is not ill-appearing or toxic-appearing.  Eyes:     General: No scleral icterus.       Right eye: No discharge.        Left eye: No  discharge.  Cardiovascular:     Rate and Rhythm: Normal rate and regular rhythm.     Pulses: Normal pulses.     Heart sounds: No murmur heard.  No friction rub.  Pulmonary:     Effort: Pulmonary effort is normal. No respiratory distress.     Breath sounds: Normal breath sounds.  Abdominal:     General: Abdomen is flat. There is no distension.     Palpations: Abdomen is soft. There is no mass.     Tenderness: There is no abdominal tenderness.     Hernia: No hernia is present.  Musculoskeletal:     Cervical back: Normal range of motion and neck supple. No rigidity or tenderness.  Skin:    General: Skin is warm and dry.     Capillary Refill: Capillary refill takes less than 2 seconds.  Neurological:     General: No focal deficit present.     Mental Status: He is alert and oriented to person,  place, and time.  Psychiatric:        Mood and Affect: Mood normal.        Behavior: Behavior normal.        Thought Content: Thought content normal.        Judgment: Judgment normal.      Assessment/Plan: 69 year old male with symptomatic hiatal hernia reflux as well as Barrett's esophagus. I Do think that antireflux surgery is indicated.  Before deciding about surgical intervention I would like to obtain a CT scan of the chest abdomen as well as a repeat barium swallow.  That we will be able to evaluate the rest of his mediastinal anatomy.  In the interim he will continue to keep his appointment for the ablation and agree with Dr. Allen Norris and will recommend performing ablation before fundoplication and hiatal hernia repair.  A copy of this report was sent to the referring provider.  Case discussed in detail with referring provider.  Please note that I have spent around 60 minutes  In this encounter with greater than 50% spent in coordination counseling of his care  Caroleen Hamman, MD [place] Surgeon

## 2020-09-03 ENCOUNTER — Ambulatory Visit
Admission: RE | Admit: 2020-09-03 | Discharge: 2020-09-03 | Disposition: A | Discharge status: Home / Self Care | Payer: Medicare PPO | Source: Ambulatory Visit | Attending: Surgery | Admitting: Surgery

## 2020-09-03 ENCOUNTER — Telehealth: Payer: Self-pay

## 2020-09-03 ENCOUNTER — Other Ambulatory Visit: Payer: Self-pay | Admitting: Surgery

## 2020-09-03 ENCOUNTER — Other Ambulatory Visit: Payer: Self-pay

## 2020-09-03 DIAGNOSIS — K449 Diaphragmatic hernia without obstruction or gangrene: Secondary | ICD-10-CM | POA: Diagnosis present

## 2020-09-03 DIAGNOSIS — K2270 Barrett's esophagus without dysplasia: Secondary | ICD-10-CM | POA: Insufficient documentation

## 2020-09-03 DIAGNOSIS — K219 Gastro-esophageal reflux disease without esophagitis: Secondary | ICD-10-CM | POA: Insufficient documentation

## 2020-09-03 IMAGING — RF DG ESOPHAGUS
9 of 14 series · 14 of 24 positions shown · non-contrast
Comparison: [DATE]

CLINICAL DATA: Hiatal hernia, Barrett's esophagus, gastroesophageal
reflux, preop assessment

EXAM:
ESOPHOGRAM/BARIUM SWALLOW
TECHNIQUE: Combined double contrast and single contrast examination performed
using effervescent crystals, thick barium liquid, and thin barium
liquid.
FLUOROSCOPY TIME:  Fluoroscopy Time:  30 seconds
Radiation Exposure Index (if provided by the fluoroscopic device):
4.1 mGy
Number of Acquired Spot Images: 0

[Series 1: cp_standard · 0.25mm/px · 2 of 20 frames shown (1 of 9)]
[frame 4/20]
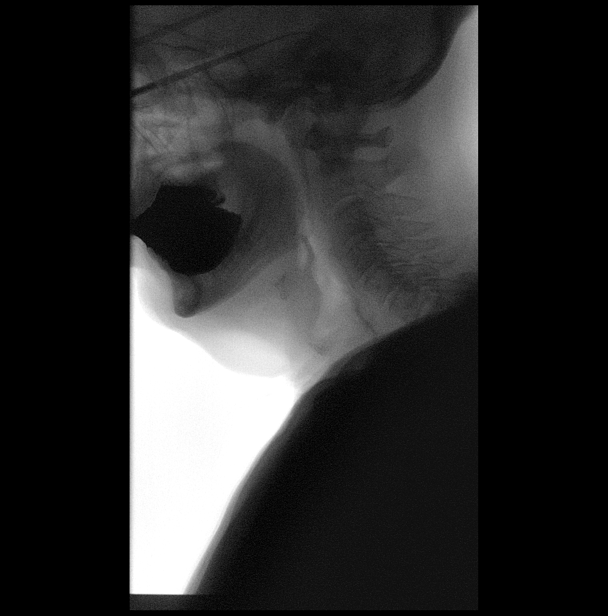
[frame 11/20]
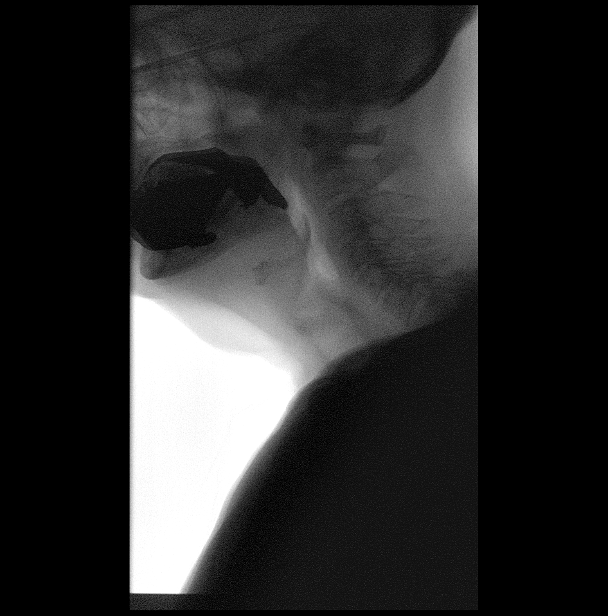

[Series 2: cp_standard · 0.25mm/px · 3 of 17 frames shown (2 of 9)]
[frame 3/17]
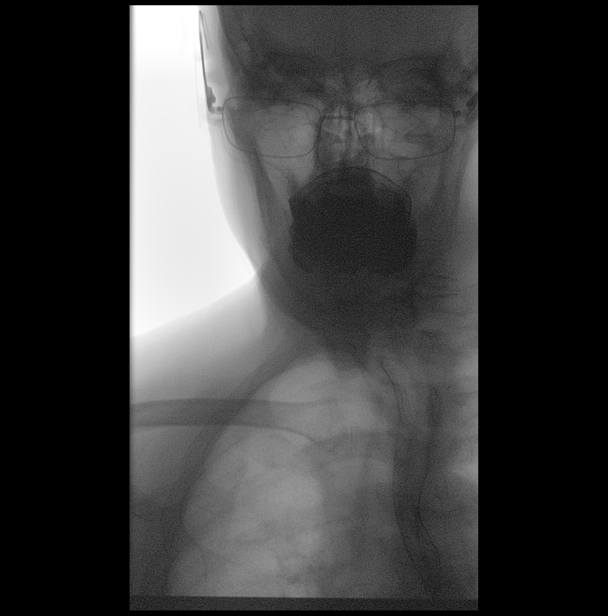
[frame 9/17]
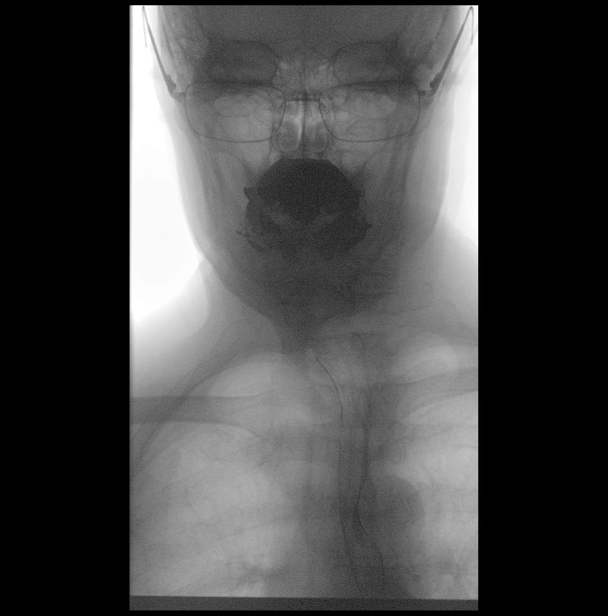
[frame 15/17]
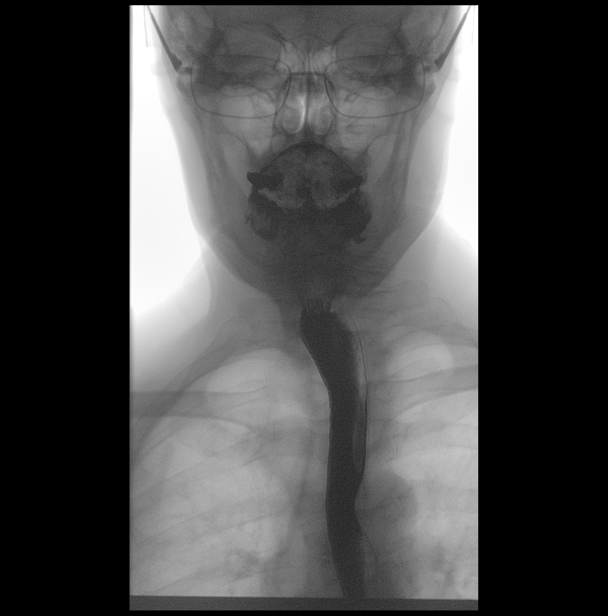

[Series 4: cp_standard · 0.25mm/px · 1 of 1 slices shown (3 of 9)]
[im 1/1]
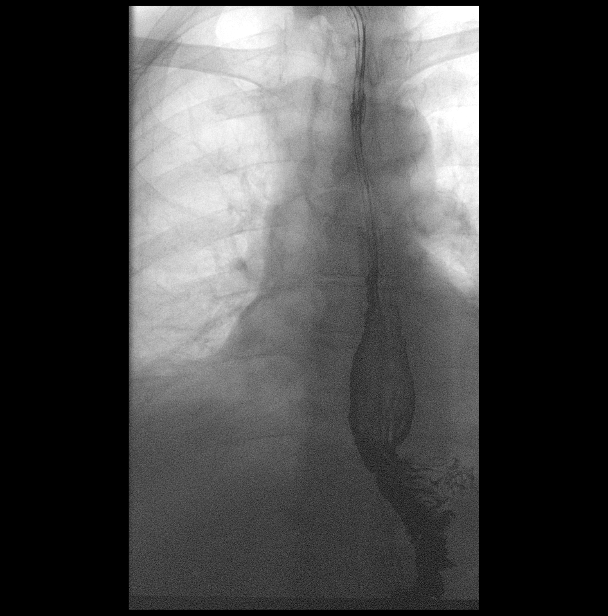

[Series 5: cp_standard · 0.26mm/px · 2 of 51 frames shown (4 of 9)]
[frame 15/51]
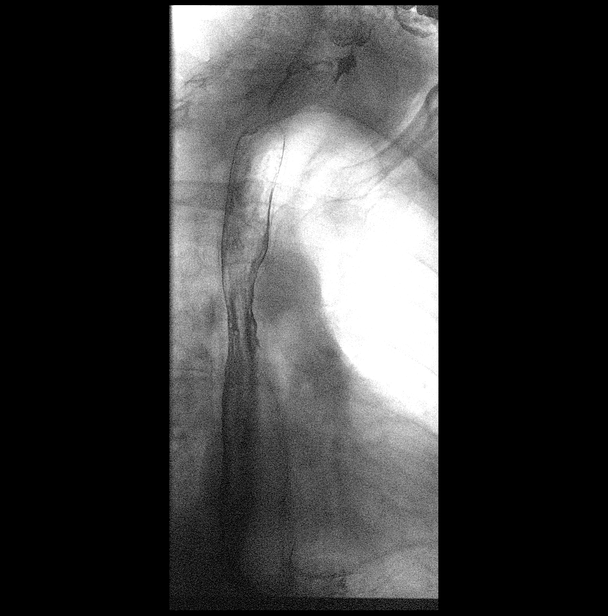
[frame 44/51]
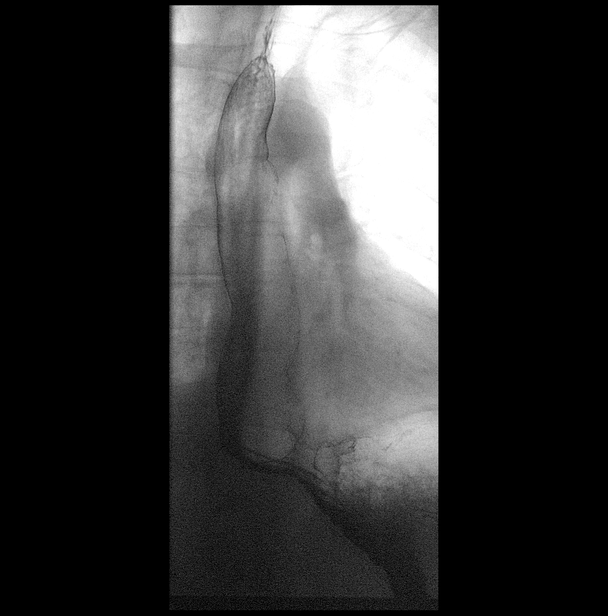

[Series 7: cp_standard · 0.26mm/px · 2 of 8 frames shown (5 of 9)]
[frame 2/8]
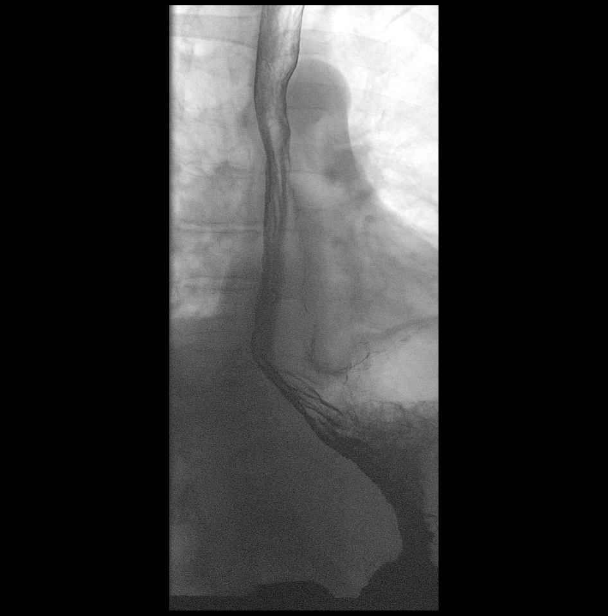
[frame 7/8]
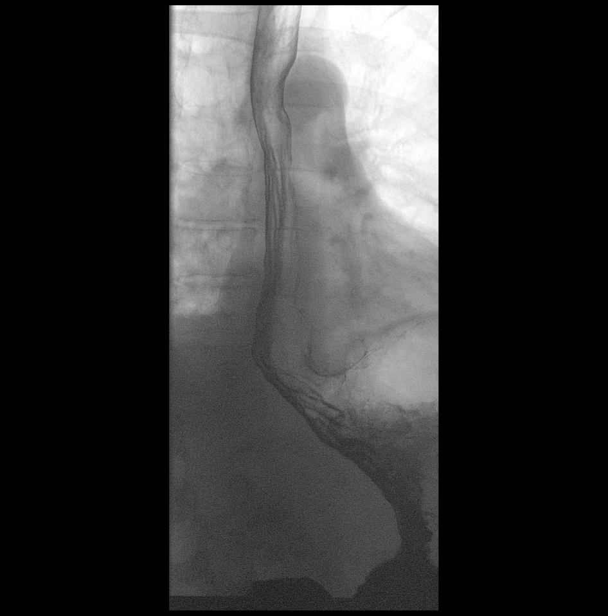

[Series 9: cp_standard · 0.28mm/px · 1 of 1 slices shown (6 of 9)]
[im 1/1]
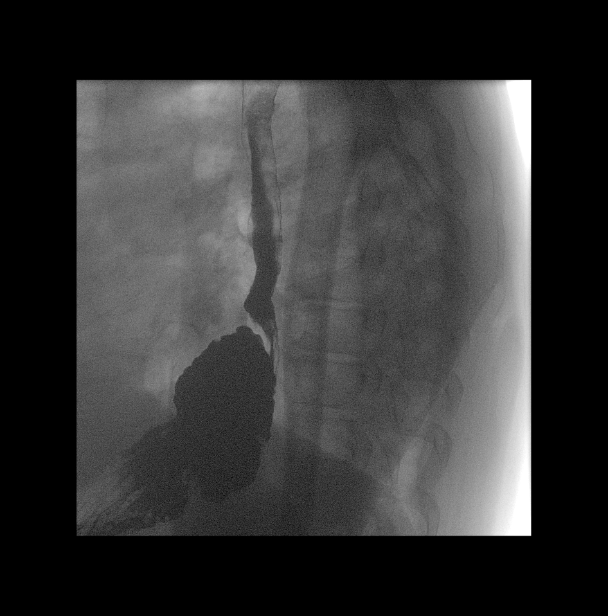

[Series 10: cp_standard · 0.28mm/px · 1 of 1 slices shown (7 of 9)]
[im 1/1]
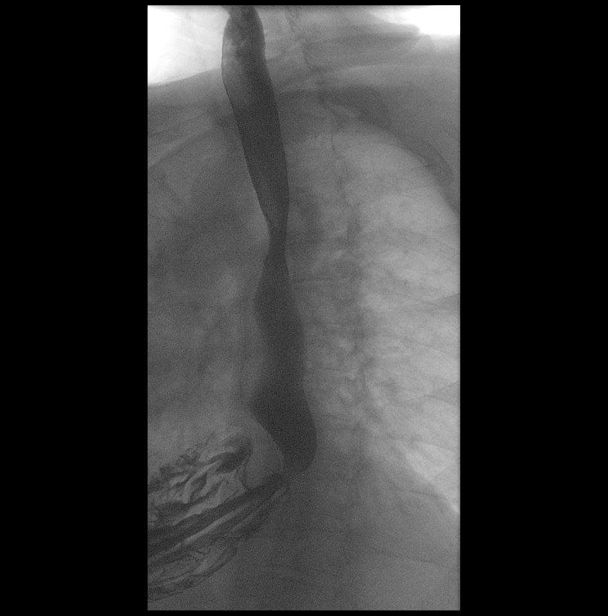

[Series 12: cp_standard · 0.28mm/px · 1 of 1 slices shown (8 of 9)]
[im 1/1]
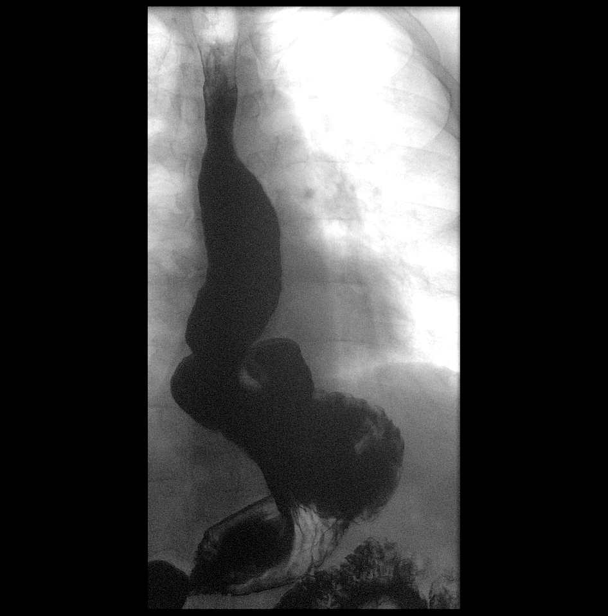

[Series 14: cp_standard · 0.27mm/px · 1 of 1 slices shown (9 of 9)]
[im 1/1]
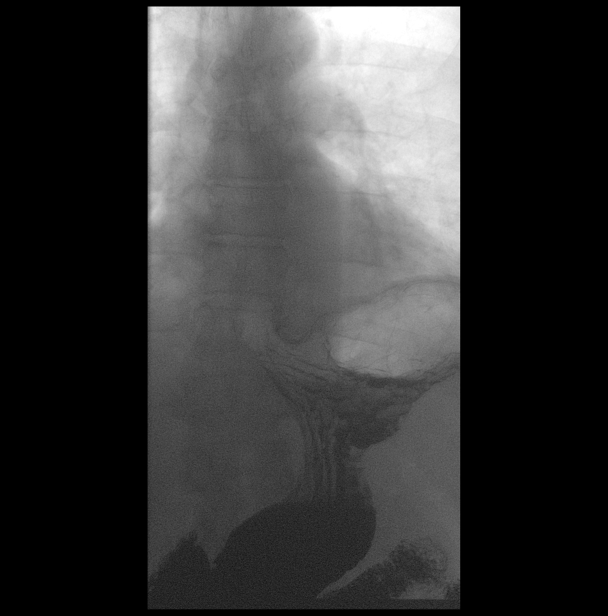

[14 of 24 positions shown; findings below may reference images not displayed]

FINDINGS: Normal pharyngeal anatomy and motility. Contrast flowed freely
through the esophagus without evidence of a stricture or mass.
Normal esophageal mucosa without evidence of irregularity or
ulceration. Esophageal motility was normal. Severe gastroesophageal
reflux. Small hiatal hernia. Possible transient small paraesophageal
hernia versus diverticulum along the cardia of the stomach.

At the end of the examination a 13 mm barium tablet was administered
which transited through the esophagus and esophagogastric junction
without delay.
IMPRESSION: Severe gastroesophageal reflux.

Small hiatal hernia. Possible transient small paraesophageal hernia
versus diverticulum along the cardia of the stomach.

## 2020-09-03 NOTE — Telephone Encounter (Signed)
Message left for patient that the Barium swallow showed a hiatal hernia with severe reflux. He is scheduled for a CT on 09/11/20 and follow up with Dr Dahlia Byes on 09/17/20. He will be going to [facility] on 09/14/20.

## 2020-09-03 NOTE — Telephone Encounter (Signed)
-----   Message from Jules Husbands, MD sent at 09/03/2020 12:21 PM EDT ----- Please let him know that he does have a hiatal hernia w severe reflux ----- Message ----- From: Interface, Rad Results In Sent: 09/03/2020   8:31 AM EDT To: Jules Husbands, MD

## 2020-09-11 ENCOUNTER — Other Ambulatory Visit: Payer: Self-pay

## 2020-09-11 ENCOUNTER — Ambulatory Visit
Admission: RE | Admit: 2020-09-11 | Discharge: 2020-09-11 | Disposition: A | Discharge status: Home / Self Care | Payer: Medicare PPO | Source: Ambulatory Visit | Attending: Surgery | Admitting: Surgery

## 2020-09-11 DIAGNOSIS — K449 Diaphragmatic hernia without obstruction or gangrene: Secondary | ICD-10-CM | POA: Insufficient documentation

## 2020-09-11 DIAGNOSIS — K2270 Barrett's esophagus without dysplasia: Secondary | ICD-10-CM | POA: Insufficient documentation

## 2020-09-11 DIAGNOSIS — K219 Gastro-esophageal reflux disease without esophagitis: Secondary | ICD-10-CM | POA: Insufficient documentation

## 2020-09-11 HISTORY — DX: Squamous cell carcinoma of skin, unspecified: C44.92

## 2020-09-11 LAB — POCT I-STAT CREATININE: Creatinine, Ser: 1 mg/dL (ref 0.61–1.24)

## 2020-09-11 IMAGING — CT CT CHEST W/ CM
3 of 5 series · 15 of 36 positions shown, 17 images · IV contrast (omnipaque)
Comparison: None.

CLINICAL DATA: Abnormal barium swallow and endoscopy, pre
esophageal dilatation, Barrett's esophagus

EXAM:
CT CHEST AND ABDOMEN WITH CONTRAST
TECHNIQUE: Multidetector CT imaging of the chest and abdomen was performed
following the standard protocol during bolus administration of
intravenous contrast.
CONTRAST:  100mL OMNIPAQUE IOHEXOL 300 MG/ML SOLN, additional oral
enteric contrast

[Series 2: axials cap 5.00 · axial · 0.71mm/px · z∈[-1344,-989]mm · 7 of 95 slices shown, 9 images]
[im 12/95  mediastinal]
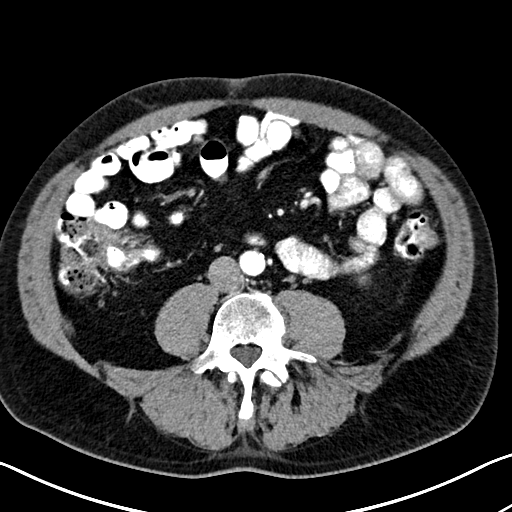
[im 12/95  lung]
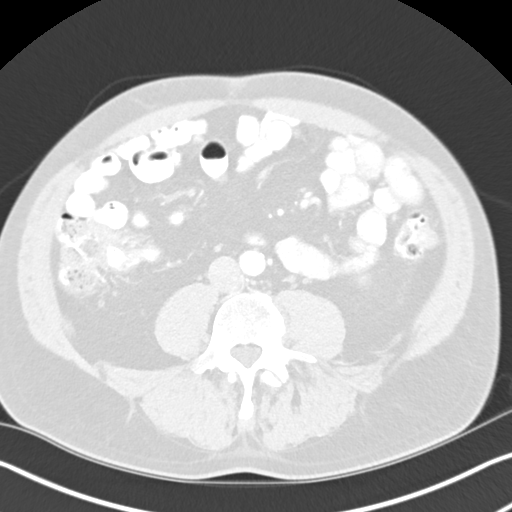
[im 24/95  lung]
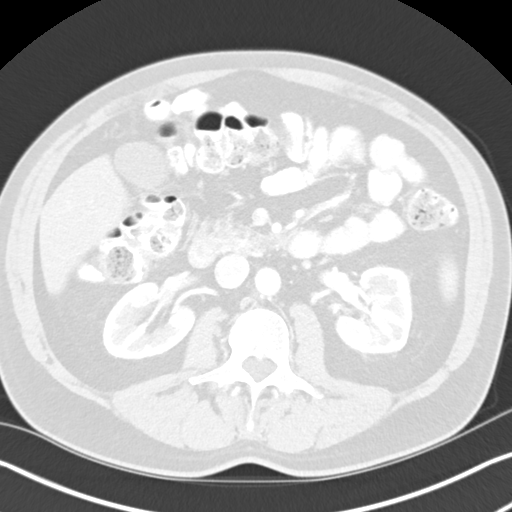
[im 36/95  lung]
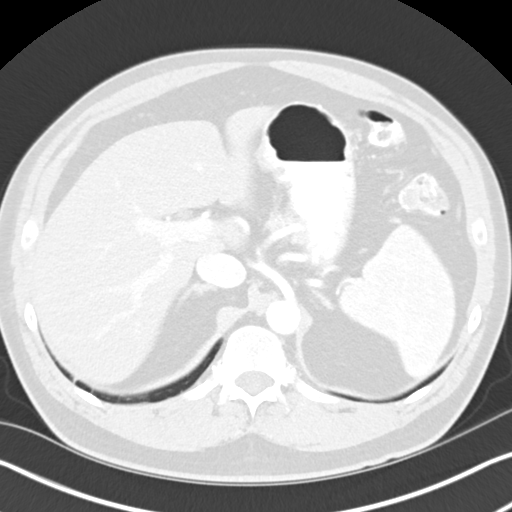
[im 48/95  lung]
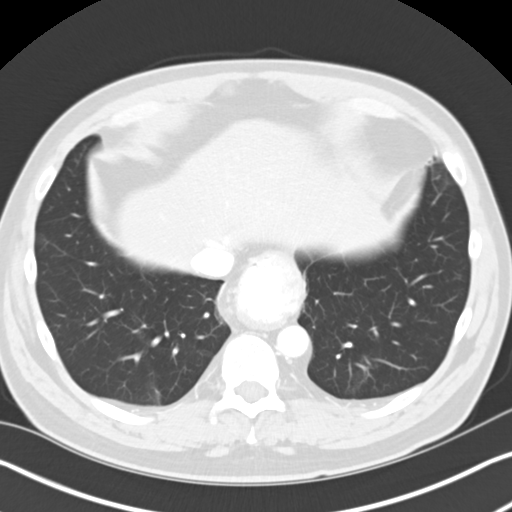
[im 59/95  mediastinal]
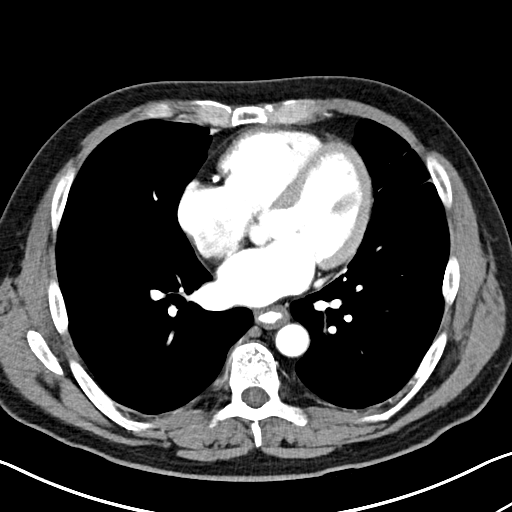
[im 59/95  lung]
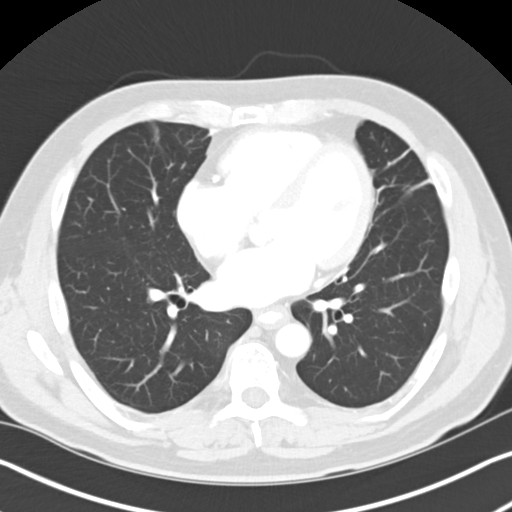
[im 71/95  lung]
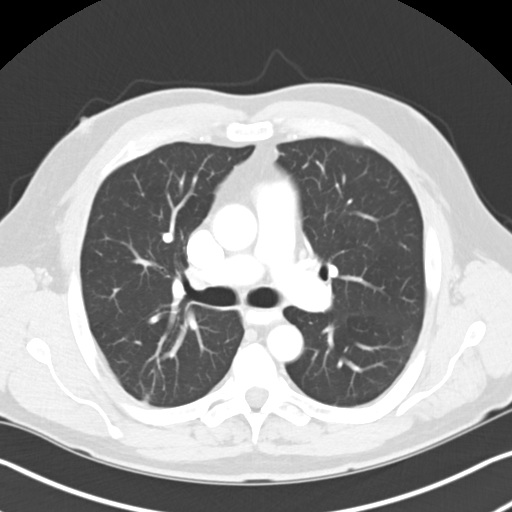
[im 83/95  lung]
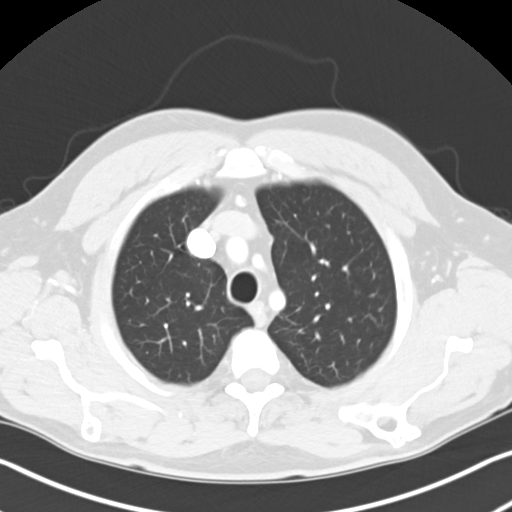

[Series 3: lungs cap 2.00 · axial · 0.71mm/px · z∈[-1226,-1078]mm · 5 of 160 slices shown]
[im 11/160  lung]
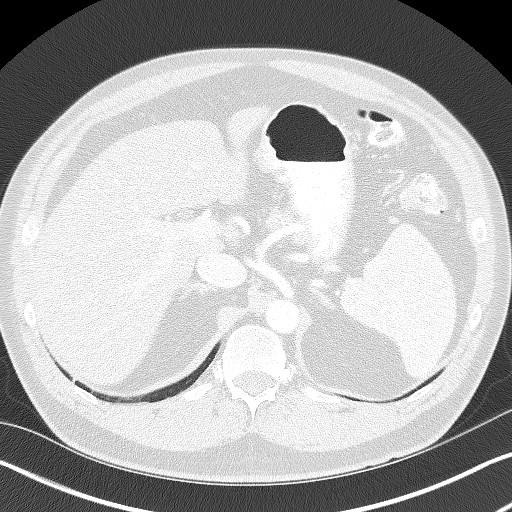
[im 32/160  lung]
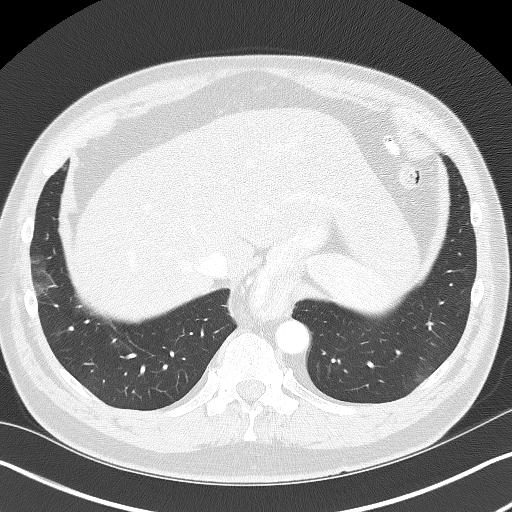
[im 54/160  lung]
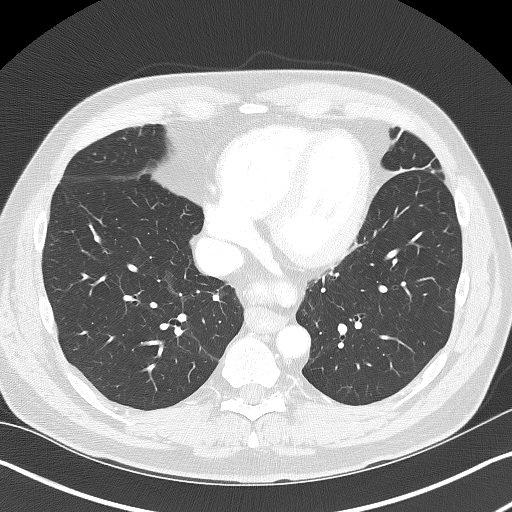
[im 75/160  lung]
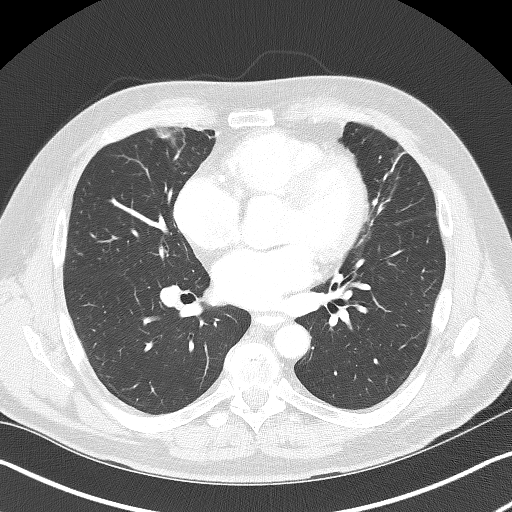
[im 85/160  lung]
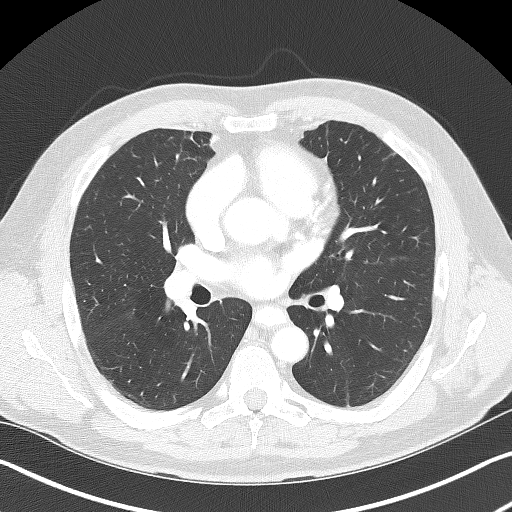

[Series 4: coronals cap 2.00 cor · coronal · 0.71mm/px · 3 of 156 slices shown]
[im 32/156  lung]
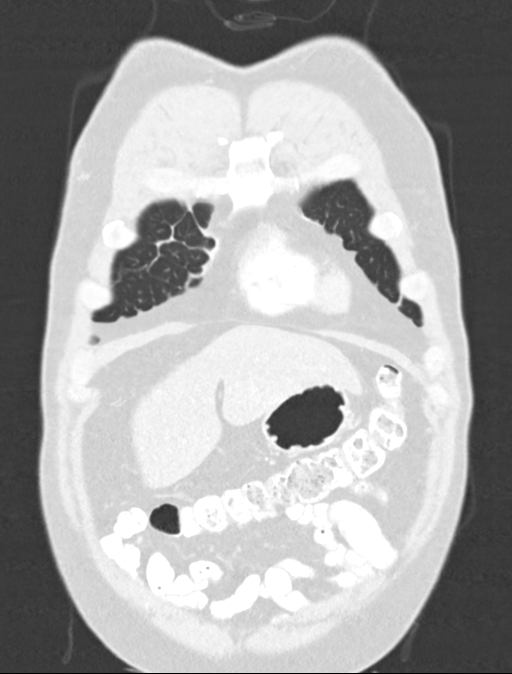
[im 63/156  lung]
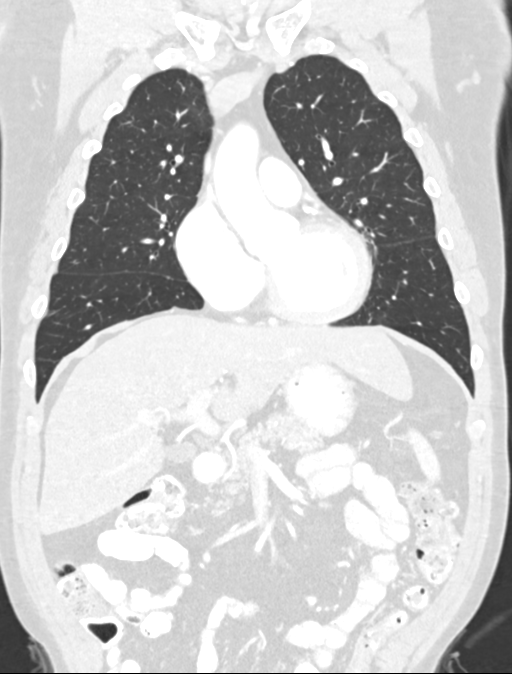
[im 94/156  lung]
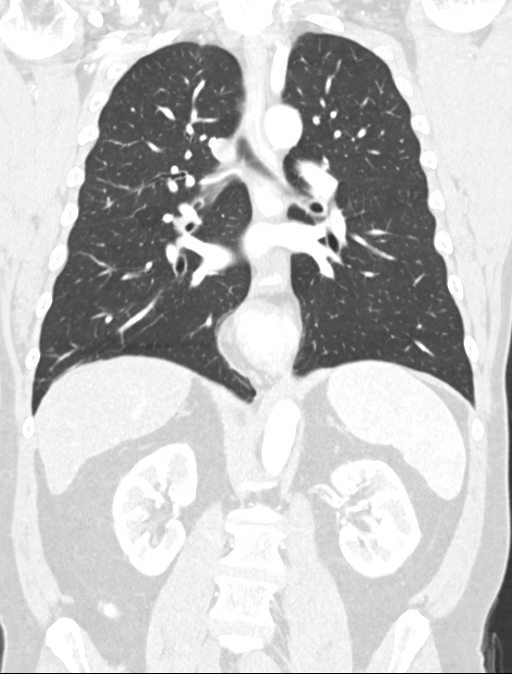

[15 of 36 positions shown; findings below may reference images not displayed]

FINDINGS: CT CHEST FINDINGS

Cardiovascular: Scattered aortic atherosclerosis scattered
three-vessel coronary artery calcifications. Normal heart size. No
pericardial effusion.

Mediastinum/Nodes: No enlarged mediastinal, hilar, or axillary lymph
nodes. Small hiatal hernia. Diffuse thickening of the lower
esophagus. Thyroid gland and trachea demonstrate no significant
findings.

Lungs/Pleura: Mild bandlike scarring of the right middle lobe and
lingula. No pleural effusion or pneumothorax.

Musculoskeletal: No chest wall mass or suspicious bone lesions
identified.

CT ABDOMEN FINDINGS

Hepatobiliary: No focal liver abnormality is seen. Hepatic
steatosis. Gallstone near the gallbladder neck. No gallbladder wall
thickening, or biliary dilatation.

Pancreas: Unremarkable. No pancreatic ductal dilatation or
surrounding inflammatory changes.

Spleen: Normal in size without focal abnormality.

Adrenals/Urinary Tract: Adrenal glands are unremarkable. Small
nonobstructive calculus of the inferior pole of the left kidney. No
hydronephrosis. Bladder is unremarkable.

Stomach/Bowel: Stomach is within normal limits. Appendix appears
normal. No evidence of bowel wall thickening, distention, or
inflammatory changes.

Vascular/Lymphatic: Aortic atherosclerosis. No enlarged abdominal
lymph nodes.

Other: No abdominal wall hernia or abnormality. No abdominopelvic
ascites.

Musculoskeletal: No acute or significant osseous findings.
IMPRESSION: 1. Small hiatal hernia with diffuse thickening of the lower
esophagus, generally consistent with reflux esophagitis. Correlate
with endoscopic findings.
2. Hepatic steatosis.
3. Cholelithiasis.
4. Nonobstructive left nephrolithiasis.
5. Coronary artery disease.  Aortic Atherosclerosis ([F4]-[F4]).

## 2020-09-11 IMAGING — CT CT ABDOMEN W/ CM
2 of 5 series · 12 of 46 positions shown, 14 images · IV contrast (omnipaque)
Comparison: None.

CLINICAL DATA: Abnormal barium swallow and endoscopy, pre
esophageal dilatation, Barrett's esophagus

EXAM:
CT CHEST AND ABDOMEN WITH CONTRAST
TECHNIQUE: Multidetector CT imaging of the chest and abdomen was performed
following the standard protocol during bolus administration of
intravenous contrast.
CONTRAST:  100mL OMNIPAQUE IOHEXOL 300 MG/ML SOLN, additional oral
enteric contrast

[Series 2: axials cap 5.00 · axial · 0.71mm/px · z∈[-1354,-979]mm · 9 of 95 slices shown, 11 images]
[im 10/95  soft-tissue]
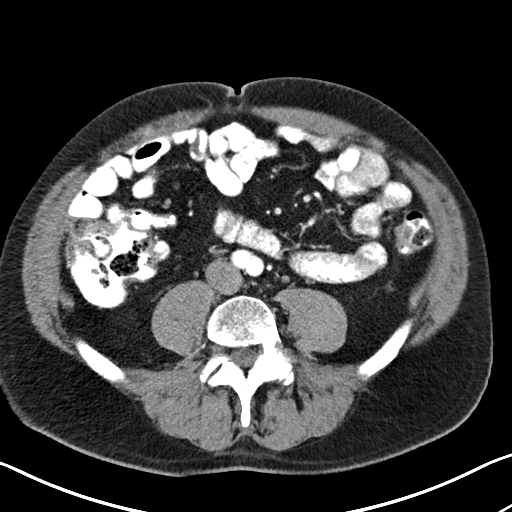
[im 10/95  bone]
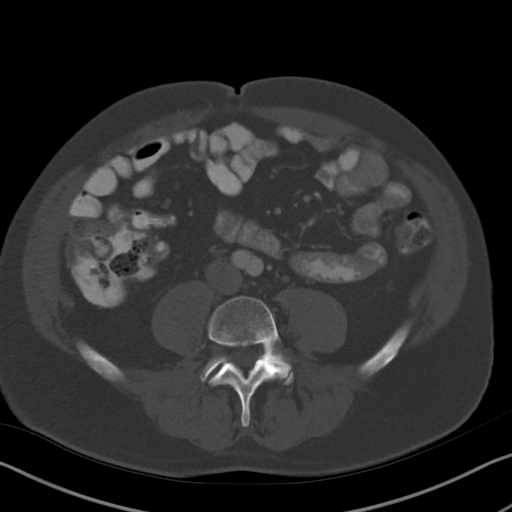
[im 19/95  soft-tissue]
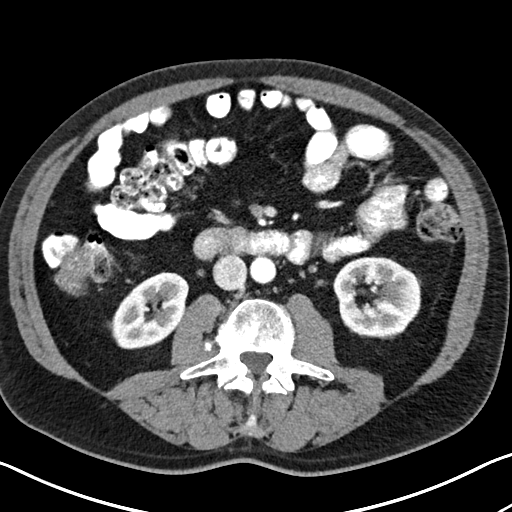
[im 29/95  soft-tissue]
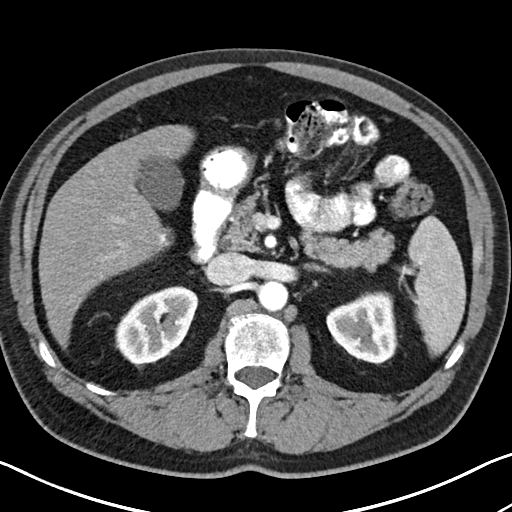
[im 38/95  soft-tissue]
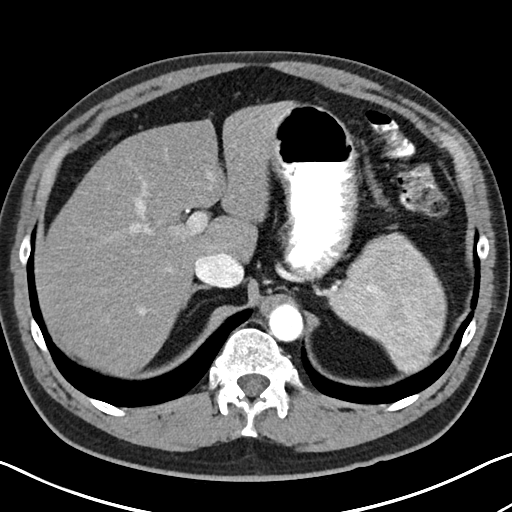
[im 48/95  soft-tissue]
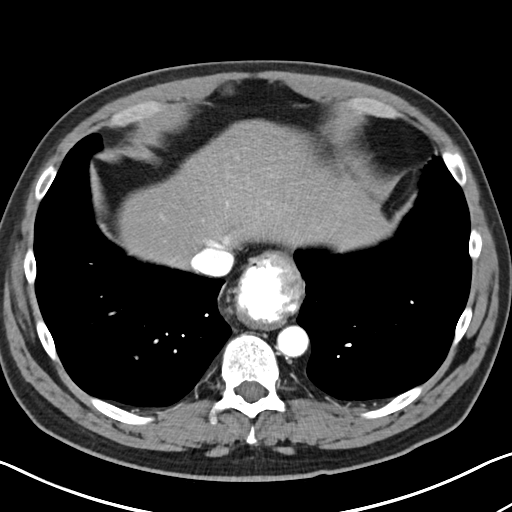
[im 57/95  soft-tissue]
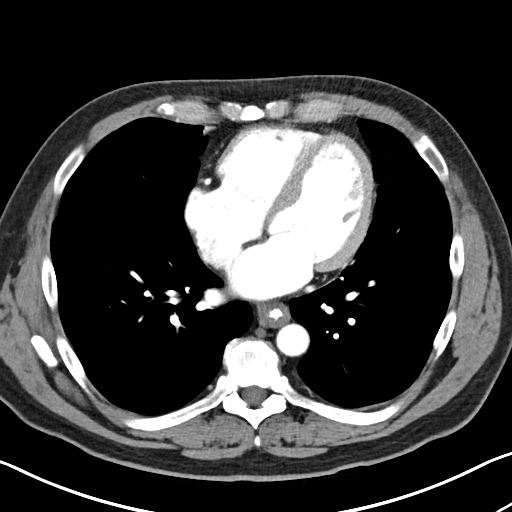
[im 66/95  soft-tissue]
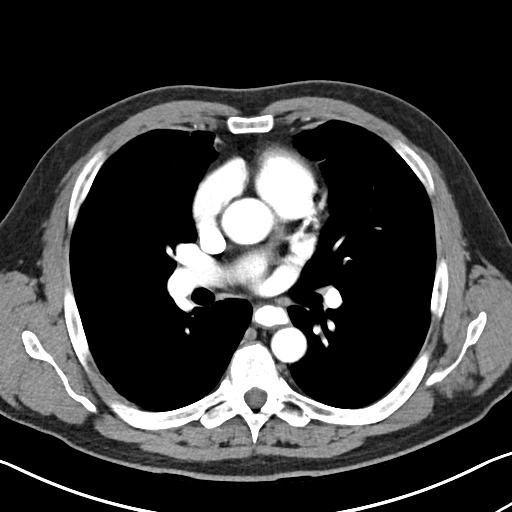
[im 76/95  soft-tissue]
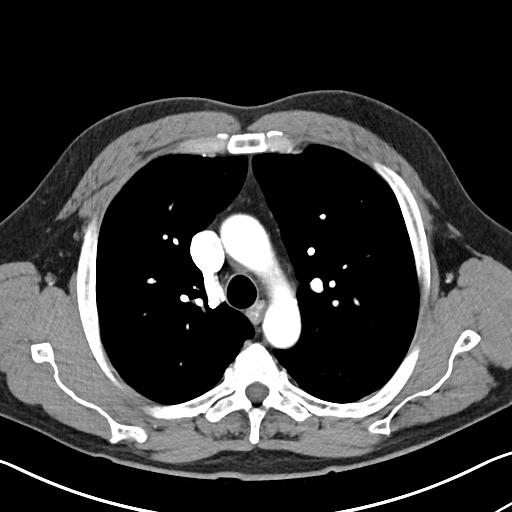
[im 85/95  soft-tissue]
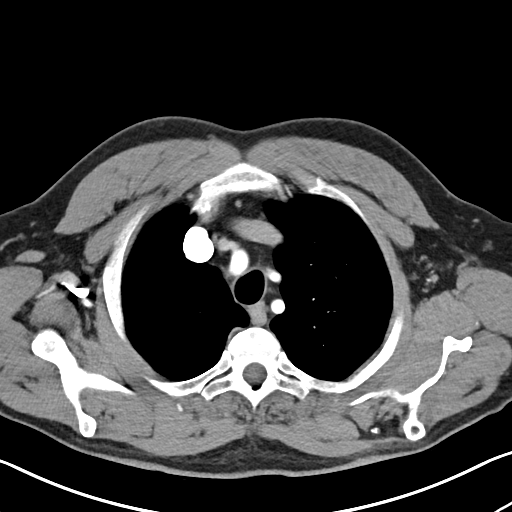
[im 85/95  bone]
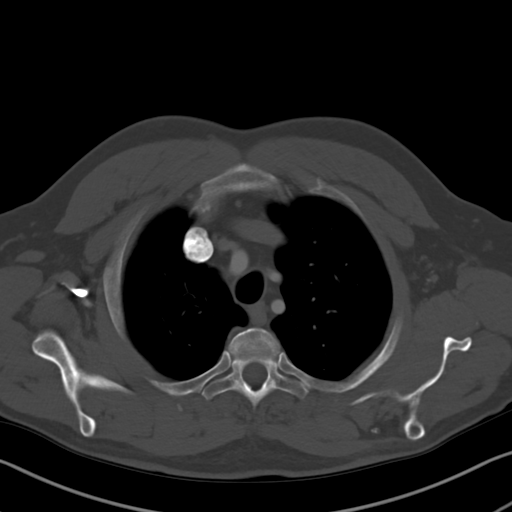

[Series 4: coronals cap 2.00 cor · coronal · 0.71mm/px · 3 of 156 slices shown]
[im 52/156  soft-tissue]
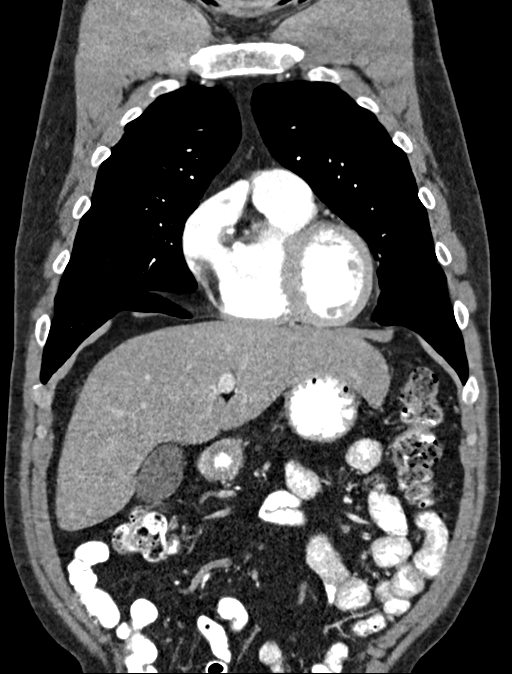
[im 69/156  soft-tissue]
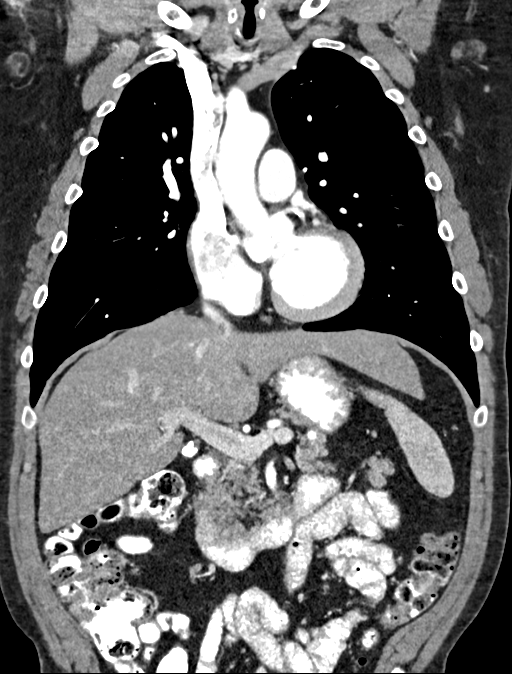
[im 87/156  soft-tissue]
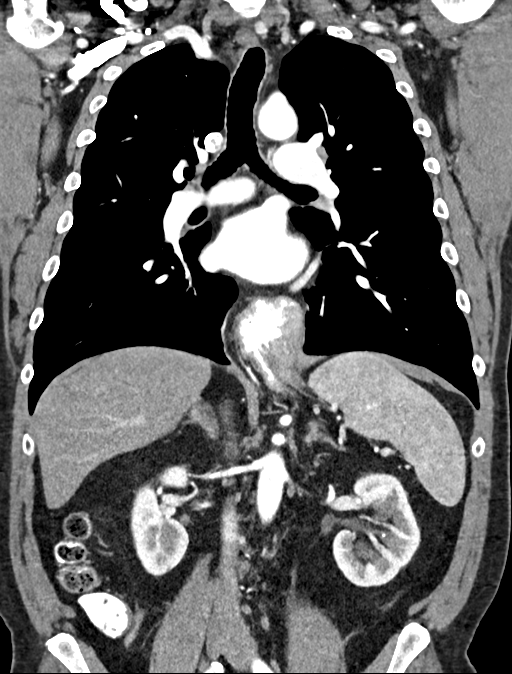

[12 of 46 positions shown; findings below may reference images not displayed]

FINDINGS: CT CHEST FINDINGS

Cardiovascular: Scattered aortic atherosclerosis scattered
three-vessel coronary artery calcifications. Normal heart size. No
pericardial effusion.

Mediastinum/Nodes: No enlarged mediastinal, hilar, or axillary lymph
nodes. Small hiatal hernia. Diffuse thickening of the lower
esophagus. Thyroid gland and trachea demonstrate no significant
findings.

Lungs/Pleura: Mild bandlike scarring of the right middle lobe and
lingula. No pleural effusion or pneumothorax.

Musculoskeletal: No chest wall mass or suspicious bone lesions
identified.

CT ABDOMEN FINDINGS

Hepatobiliary: No focal liver abnormality is seen. Hepatic
steatosis. Gallstone near the gallbladder neck. No gallbladder wall
thickening, or biliary dilatation.

Pancreas: Unremarkable. No pancreatic ductal dilatation or
surrounding inflammatory changes.

Spleen: Normal in size without focal abnormality.

Adrenals/Urinary Tract: Adrenal glands are unremarkable. Small
nonobstructive calculus of the inferior pole of the left kidney. No
hydronephrosis. Bladder is unremarkable.

Stomach/Bowel: Stomach is within normal limits. Appendix appears
normal. No evidence of bowel wall thickening, distention, or
inflammatory changes.

Vascular/Lymphatic: Aortic atherosclerosis. No enlarged abdominal
lymph nodes.

Other: No abdominal wall hernia or abnormality. No abdominopelvic
ascites.

Musculoskeletal: No acute or significant osseous findings.
IMPRESSION: 1. Small hiatal hernia with diffuse thickening of the lower
esophagus, generally consistent with reflux esophagitis. Correlate
with endoscopic findings.
2. Hepatic steatosis.
3. Cholelithiasis.
4. Nonobstructive left nephrolithiasis.
5. Coronary artery disease.  Aortic Atherosclerosis ([F4]-[F4]).

## 2020-09-11 MED ORDER — IOHEXOL 300 MG/ML  SOLN
100.0000 mL | Freq: Once | INTRAMUSCULAR | Status: AC | PRN
  Administered 2020-09-11: 100 mL via INTRAVENOUS

## 2020-09-17 ENCOUNTER — Ambulatory Visit: Payer: Medicare PPO | Admitting: Surgery

## 2020-12-21 ENCOUNTER — Telehealth: Payer: Self-pay | Admitting: Family Medicine

## 2020-12-21 NOTE — Telephone Encounter (Signed)
Copied from [place]. Topic: Medicare AWV >> Dec 21, 2020  2:12 PM Cher Nakai R wrote: Reason for CRM: Left message for patient to call back and schedule Medicare Annual Wellness Visit (AWV) in office.   If not able to come in office, please offer to do virtually.   Last AWV 08/18/2018  Please schedule at anytime with [facility] Health Advisor.  If any questions, please contact me at [phone]

## 2020-12-24 ENCOUNTER — Encounter: Payer: Self-pay | Admitting: Urology

## 2020-12-25 ENCOUNTER — Ambulatory Visit: Payer: Medicare PPO | Admitting: Family Medicine

## 2020-12-25 ENCOUNTER — Encounter: Payer: Self-pay | Admitting: Family Medicine

## 2020-12-25 ENCOUNTER — Other Ambulatory Visit: Payer: Self-pay

## 2020-12-25 VITALS — BP 116/73 | HR 60 | Temp 98.4°F | Resp 16 | Ht 70.0 in | Wt 218.0 lb

## 2020-12-25 DIAGNOSIS — H6502 Acute serous otitis media, left ear: Secondary | ICD-10-CM

## 2020-12-25 DIAGNOSIS — E782 Mixed hyperlipidemia: Secondary | ICD-10-CM

## 2020-12-25 DIAGNOSIS — I2510 Atherosclerotic heart disease of native coronary artery without angina pectoris: Secondary | ICD-10-CM | POA: Diagnosis not present

## 2020-12-25 MED ORDER — ROSUVASTATIN CALCIUM 20 MG PO TABS: 20.0000 mg | ORAL_TABLET | Freq: Every day | ORAL | 3 refills | Status: DC

## 2020-12-25 NOTE — Progress Notes (Signed)
Established patient visit   Patient: Lucas Christensen   DOB: 09-02-1951   70 y.o. Male  MRN: [id number] Visit Date: 12/25/2020  Today's healthcare provider: Wilhemena Durie, MD   Chief Complaint  Patient presents with  . Dizziness   Subjective    Dizziness This is a recurrent problem. The current episode started more than 1 month ago. The problem has been waxing and waning. Associated symptoms include nausea and vertigo. Pertinent negatives include no congestion, coughing, fatigue, fever or headaches.  The symptoms started 6 weeks ago when he felt like he was falling to the right but this is since improved. Patient denies having any sinus issues. He reports that he does have a fullness sensation in his left ear.  Seen Dr. Tami Ribas in the past for left ear problems with hearing. Also of note recently patient had CT scan which showed aortic ASCVD.    Medications: Outpatient Medications Prior to Visit  Medication Sig  . Ascorbic Acid (VITAMIN C) 1000 MG tablet Take by mouth.  . cholecalciferol (VITAMIN D) 1000 units tablet Take 1,000 Units by mouth daily.  . Multiple Vitamin (MULTIVITAMIN) tablet Take 1 tablet by mouth daily.  . niacin 500 MG CR capsule Take by mouth.  . pantoprazole (PROTONIX) 40 MG tablet Take 1 tablet (40 mg total) by mouth daily.  . vitamin E 1000 UNIT capsule Take by mouth.   No facility-administered medications prior to visit.    Review of Systems  Constitutional: Negative for fatigue and fever.  HENT: Negative for congestion.   Respiratory: Negative for cough.   Gastrointestinal: Positive for nausea.  Neurological: Positive for dizziness and vertigo. Negative for headaches.      Objective    BP 116/73   Pulse 60   Temp 98.4 F (36.9 C)   Resp 16   Ht 5\' 10"  (1.778 m)   Wt 218 lb (98.9 kg)   BMI 31.28 kg/m    Physical Exam Vitals reviewed.  Constitutional:      Appearance: He is well-developed.  HENT:     Head: Normocephalic and  atraumatic.     Right Ear: External ear normal.     Left Ear: External ear normal.     Nose: Nose normal.  Eyes:     Conjunctiva/sclera: Conjunctivae normal.     Pupils: Pupils are equal, round, and reactive to light.  Cardiovascular:     Rate and Rhythm: Normal rate and regular rhythm.     Heart sounds: Normal heart sounds.  Pulmonary:     Effort: Pulmonary effort is normal.     Breath sounds: Normal breath sounds.  Abdominal:     Palpations: Abdomen is soft.  Genitourinary:    Penis: Normal.      Prostate: Normal.     Rectum: Normal.  Musculoskeletal:        General: Normal range of motion.     Cervical back: Normal range of motion and neck supple.     Right lower leg: No edema.     Left lower leg: No edema.  Skin:    General: Skin is warm and dry.  Neurological:     General: No focal deficit present.     Mental Status: He is alert and oriented to person, place, and time.  Psychiatric:        Mood and Affect: Mood normal.        Behavior: Behavior normal.        Thought  Content: Thought content normal.        Judgment: Judgment normal.       No results found for any visits on 12/25/20.  Assessment & Plan     1. Acute serous otitis media of left ear, recurrence not specified No treatment necessary.  Vertigo is improving.  I think this started as a viral illness.. Vertigo is improving./Resolved. 2. ASCVD (arteriosclerotic cardiovascular disease) Need to treat lipids with this found on CT scan.  3. Mixed hyperlipidemia Start rosuvastatin.  Follow-up 1 to 2 months. - rosuvastatin (CRESTOR) 20 MG tablet; Take 1 tablet (20 mg total) by mouth daily.  Dispense: 90 tablet; Refill: 3   No follow-ups on file.      I, Wilhemena Durie, MD, have reviewed all documentation for this visit. The documentation on 12/30/20 for the exam, diagnosis, procedures, and orders are all accurate and complete.    Ben Habermann Cranford Mon, MD  [facility] [phone]  (phone) [phone] (fax)  [place]

## 2020-12-26 ENCOUNTER — Other Ambulatory Visit: Payer: Self-pay | Admitting: Family Medicine

## 2020-12-26 DIAGNOSIS — R972 Elevated prostate specific antigen [PSA]: Secondary | ICD-10-CM

## 2020-12-26 DIAGNOSIS — R5383 Other fatigue: Secondary | ICD-10-CM

## 2021-01-02 ENCOUNTER — Other Ambulatory Visit: Payer: Self-pay

## 2021-01-02 ENCOUNTER — Other Ambulatory Visit: Payer: Medicare PPO

## 2021-01-02 DIAGNOSIS — R972 Elevated prostate specific antigen [PSA]: Secondary | ICD-10-CM

## 2021-01-02 DIAGNOSIS — R5383 Other fatigue: Secondary | ICD-10-CM

## 2021-01-03 LAB — TESTOSTERONE: Testosterone: 318 ng/dL (ref 264–916)

## 2021-01-03 LAB — PSA: Prostate Specific Ag, Serum: 3.1 ng/mL (ref 0.0–4.0)

## 2021-01-09 ENCOUNTER — Ambulatory Visit: Payer: Medicare PPO | Admitting: Urology

## 2021-01-09 ENCOUNTER — Encounter: Payer: Self-pay | Admitting: Urology

## 2021-01-09 ENCOUNTER — Other Ambulatory Visit: Payer: Self-pay

## 2021-01-09 VITALS — BP 121/78 | HR 70 | Ht 70.0 in | Wt 200.0 lb

## 2021-01-09 DIAGNOSIS — Z87898 Personal history of other specified conditions: Secondary | ICD-10-CM | POA: Diagnosis not present

## 2021-01-09 DIAGNOSIS — N200 Calculus of kidney: Secondary | ICD-10-CM

## 2021-01-09 DIAGNOSIS — N401 Enlarged prostate with lower urinary tract symptoms: Secondary | ICD-10-CM

## 2021-01-09 MED ORDER — TADALAFIL 5 MG PO TABS: 5.0000 mg | ORAL_TABLET | Freq: Every day | ORAL | 3 refills | Status: DC | PRN

## 2021-01-09 NOTE — Progress Notes (Signed)
01/09/2021 2:39 PM   Lucas Christensen Apr 17, 1951 [id number]  Referring provider: Jerrol Banana., MD [address] [place],  [place] [address]  Chief Complaint  Patient presents with  . Elevated PSA    Urologic history: 1.History of stone disease -status post left ureteroscopic stone removal May 2014  2.History elevated PSA -Baseline PSA upper 1/low 2 range; intermittent elevation as high as 6.1 -No previous biopsy  3.BPH with lower urinary tract symptoms -Status post UroLift in [place] 2018 -On tadalafil 5 mg daily for mild to moderate LUTS -Unable to tolerate alpha-blocker secondary to orthostatic symptoms  4.Episode acute prostatitis September 2019   HPI: 70 y.o. male presents for annual follow-up.   Overall he has done well since his last visit.  Ran out of tadalafil for BPH ~2 months ago and has noted worsening symptoms since being off the medication  Denies dysuria, gross hematuria recurrent prostatitis symptoms  PSA 01/02/2021 3.1  CT abdomen pelvis ordered by general surgery October 2021 for evaluation of GI symptoms and incidentally noted to have left nephrolithiasis, the report did not detail stone size    PMH: Past Medical History:  Diagnosis Date  . Arthritis    left shoulder  . Barrett esophagus   . BPH (benign prostatic hyperplasia)   . Calculus of both kidneys   . Chronic prostatitis   . Elevated PSA   . GERD (gastroesophageal reflux disease)   . Hiatal hernia   . Hyperlipidemia   . Prostatitis   . Squamous cell skin cancer   . Testicular hypofunction   . Wears glasses     Surgical History: Past Surgical History:  Procedure Laterality Date  . COLONOSCOPY WITH PROPOFOL N/A 09/13/2018   Procedure: COLONOSCOPY WITH PROPOFOL;  Surgeon: Lucilla Lame, MD;  Location: [facility];  Service: Endoscopy;  Laterality: N/A;  . ESOPHAGOGASTRODUODENOSCOPY (EGD) WITH PROPOFOL N/A 08/16/2020   Procedure:  ESOPHAGOGASTRODUODENOSCOPY (EGD) WITH PROPOFOL;  Surgeon: Lucilla Lame, MD;  Location: [facility];  Service: Endoscopy;  Laterality: N/A;  . HERNIA REPAIR Left 2005   left inguinal  . PROSTATE SURGERY    . SHOULDER SURGERY Left     Home Medications:  Allergies as of 01/09/2021      Reactions   Flomax [tamsulosin]    Dizzy      Medication List       Accurate as of January 09, 2021  2:39 PM. If you have any questions, ask your nurse or doctor.        cholecalciferol 1000 units tablet Commonly known as: VITAMIN D Take 1,000 Units by mouth daily.   multivitamin tablet Take 1 tablet by mouth daily.   niacin 500 MG CR capsule Take by mouth.   pantoprazole 40 MG tablet Commonly known as: Protonix Take 1 tablet (40 mg total) by mouth daily.   rosuvastatin 20 MG tablet Commonly known as: Crestor Take 1 tablet (20 mg total) by mouth daily.   vitamin C 1000 MG tablet Take by mouth.   vitamin E 1000 UNIT capsule Take by mouth.       Allergies:  Allergies  Allergen Reactions  . Flomax [Tamsulosin]     Dizzy     Family History: Family History  Problem Relation Age of Onset  . Hypertension Mother   . Heart attack Mother   . Lung cancer Mother   . Arthritis Father   . Diabetes Father   . Hypertension Sister   . Multiple myeloma  Sister   . Diabetes Maternal Grandmother     Social History:  reports that he has never smoked. He has never used smokeless tobacco. He reports that he does not drink alcohol and does not use drugs.   Physical Exam: BP 121/78   Pulse 70   Ht '5\' 10"'  (1.778 m)   Wt 200 lb (90.7 kg)   BMI 28.70 kg/m   Constitutional:  Alert and oriented, No acute distress. HEENT: [place] AT, moist mucus membranes.  Trachea midline, no masses. Cardiovascular: No clubbing, cyanosis, or edema. Respiratory: Normal respiratory effort, no increased work of breathing. GU: Prostate 60+ cc, smooth without nodules Skin: No rashes, bruises or suspicious  lesions. Neurologic: Grossly intact, no focal deficits, moving all 4 extremities. Psychiatric: Normal mood and affect.   Pertinent Imaging: CT abdomen/pelvis 09/11/2020 personally reviewed and interpreted.  There is a nonobstructing midpole left renal calculus measuring proximately 3 mm and left lower pole renal calculi measuring 8 and 2 mm   Assessment & Plan:    1.  BPH with LUTS  Voiding pattern better on tamsulosin 5 mg daily which was refilled  2.  History elevated PSA  Benign DRE, recent PSA in normal range 3.1  Continue annual checks  3.  Left nephrolithiasis  New diagnosis  He was concerned about trying to pass a stone and the lower pole calculi was 8 mm  CT was reviewed the day after his office visit.  He was sent a MyChart message discussing options of surveillance and shockwave lithotripsy.    Abbie Sons, [place] [address], [place], [place] [address] [phone]

## 2021-01-10 ENCOUNTER — Encounter: Payer: Self-pay | Admitting: Urology

## 2021-01-10 ENCOUNTER — Other Ambulatory Visit: Payer: Self-pay | Admitting: Urology

## 2021-01-10 DIAGNOSIS — N200 Calculus of kidney: Secondary | ICD-10-CM

## 2021-01-16 ENCOUNTER — Other Ambulatory Visit: Payer: Self-pay | Admitting: Unknown Physician Specialty

## 2021-01-16 DIAGNOSIS — H90A22 Sensorineural hearing loss, unilateral, left ear, with restricted hearing on the contralateral side: Secondary | ICD-10-CM

## 2021-01-28 ENCOUNTER — Other Ambulatory Visit: Payer: Medicare PPO

## 2021-01-30 NOTE — Progress Notes (Signed)
Established patient visit   Patient: Lucas Christensen   DOB: 03/12/1951   70 y.o. Male  MRN: [id number] Visit Date: 01/31/2021  Today's healthcare provider: Wilhemena Durie, MD   Chief Complaint  Patient presents with   Hyperlipidemia   Gastroesophageal Reflux   Subjective    HPI  Patient is doing well and tolerating Crestor well.  He is exercising 6 days a week aggressively and has lost about 10 pounds.  He feels great. He wants to discuss long segment Barrett's esophagus he has along with hiatal hernia and severe reflux.  It has been recommended that he have a esophageal wrap surgery and he is a little hesitant to do this. He has now undergone yearly endoscopy by Dr. Verl Blalock. Lipid/Cholesterol, Follow-up  Last lipid panel Other pertinent labs  Lab Results  Component Value Date   CHOL 218 (H) 02/07/2019   HDL 55 02/07/2019   LDLCALC 147 (H) 02/07/2019   TRIG 82 02/07/2019   CHOLHDL 4.0 02/07/2019   Lab Results  Component Value Date   ALT 25 02/07/2019   AST 30 02/07/2019   PLT 215 02/07/2019   TSH 2.170 02/07/2019     He was last seen for this 1 months ago.  Management since that visit includes starting rosuvastatin 20mg .  He reports good compliance with treatment. He is not having side effects.   Symptoms: No chest pain No chest pressure/discomfort  No dyspnea No lower extremity edema  No numbness or tingling of extremity No orthopnea  No palpitations No paroxysmal nocturnal dyspnea  No speech difficulty No syncope   Current diet: well balanced Current exercise: no regular exercise  The 10-year ASCVD risk score Mikey Bussing DC Jr., et al., 2013) is: 11.6%  Patient also mentions that he has been exercising about 6 days weekly.       Medications: Outpatient Medications Prior to Visit  Medication Sig   Ascorbic Acid (VITAMIN C) 1000 MG tablet Take by mouth.   cholecalciferol (VITAMIN D) 1000 units tablet Take 1,000 Units by mouth daily.   Multiple  Vitamin (MULTIVITAMIN) tablet Take 1 tablet by mouth daily.   niacin 500 MG CR capsule Take by mouth.   pantoprazole (PROTONIX) 40 MG tablet Take 1 tablet (40 mg total) by mouth daily.   rosuvastatin (CRESTOR) 20 MG tablet Take 1 tablet (20 mg total) by mouth daily.   tadalafil (CIALIS) 5 MG tablet Take 1 tablet (5 mg total) by mouth daily as needed for erectile dysfunction.   vitamin E 1000 UNIT capsule Take by mouth.   No facility-administered medications prior to visit.    Review of Systems  Constitutional: Negative.   Respiratory: Negative.   Cardiovascular: Negative.   Gastrointestinal: Negative.   Musculoskeletal: Negative.   Neurological: Negative.        Objective    BP 106/70    Pulse (!) 49    Temp 98.1 F (36.7 C)    Resp 16    Ht 5\' 10"  (1.778 m)    Wt 206 lb (93.4 kg)    BMI 29.56 kg/m     Physical Exam Vitals reviewed.  Constitutional:      Appearance: He is well-developed.  HENT:     Head: Normocephalic and atraumatic.     Right Ear: External ear normal.     Left Ear: External ear normal.     Nose: Nose normal.  Eyes:     Conjunctiva/sclera: Conjunctivae normal.  Pupils: Pupils are equal, round, and reactive to light.  Cardiovascular:     Rate and Rhythm: Normal rate and regular rhythm.     Heart sounds: Normal heart sounds.  Pulmonary:     Effort: Pulmonary effort is normal.     Breath sounds: Normal breath sounds.  Abdominal:     General: Bowel sounds are normal.     Palpations: Abdomen is soft.  Musculoskeletal:     Cervical back: Normal range of motion and neck supple.     Right lower leg: No edema.     Left lower leg: No edema.  Skin:    General: Skin is warm and dry.  Neurological:     General: No focal deficit present.     Mental Status: He is alert and oriented to person, place, and time.  Psychiatric:        Mood and Affect: Mood normal.        Behavior: Behavior normal.        Thought Content: Thought content normal.         Judgment: Judgment normal.       No results found for any visits on 01/31/21.  Assessment & Plan     1. Mixed hyperlipidemia Tolerating Crestor well. - Lipid panel - Comprehensive metabolic panel  2. Gastroesophageal reflux disease with esophagitis, unspecified whether hemorrhage Patient has follow-up endoscopy with GI in a few months.  He will discuss potential wrapping surgery for hiatal hernia and reflux disease at that time.  I do think he is a good candidate and I think it is fine to wait.  3. ASCVD (arteriosclerotic cardiovascular disease) Medically stable   No follow-ups on file.      I, Wilhemena Durie, MD, have reviewed all documentation for this visit. The documentation on 01/31/21 for the exam, diagnosis, procedures, and orders are all accurate and complete.    Zeva Leber Cranford Mon, MD  [facility] [phone] (phone) [phone] (fax)  [place]

## 2021-01-31 ENCOUNTER — Ambulatory Visit: Payer: Medicare PPO | Admitting: Family Medicine

## 2021-01-31 ENCOUNTER — Other Ambulatory Visit: Payer: Self-pay

## 2021-01-31 ENCOUNTER — Encounter: Payer: Self-pay | Admitting: Family Medicine

## 2021-01-31 VITALS — BP 106/70 | HR 49 | Temp 98.1°F | Resp 16 | Ht 70.0 in | Wt 206.0 lb

## 2021-01-31 DIAGNOSIS — I2510 Atherosclerotic heart disease of native coronary artery without angina pectoris: Secondary | ICD-10-CM

## 2021-01-31 DIAGNOSIS — E782 Mixed hyperlipidemia: Secondary | ICD-10-CM

## 2021-01-31 DIAGNOSIS — K2100 Gastro-esophageal reflux disease with esophagitis, without bleeding: Secondary | ICD-10-CM | POA: Diagnosis not present

## 2021-02-01 LAB — LIPID PANEL
Chol/HDL Ratio: 2.3 ratio (ref 0.0–5.0)
Cholesterol, Total: 147 mg/dL (ref 100–199)
HDL: 63 mg/dL (ref >39)
LDL Chol Calc (NIH): 71 mg/dL (ref 0–99)
Triglycerides: 67 mg/dL (ref 0–149)
VLDL Cholesterol Cal: 13 mg/dL (ref 5–40)

## 2021-02-01 LAB — COMPREHENSIVE METABOLIC PANEL
ALT: 34 IU/L (ref 0–44)
AST: 41 IU/L — ABNORMAL HIGH (ref 0–40)
Albumin/Globulin Ratio: 1.7 (ref 1.2–2.2)
Albumin: 4.4 g/dL (ref 3.8–4.8)
Alkaline Phosphatase: 93 IU/L (ref 44–121)
BUN/Creatinine Ratio: 9 — ABNORMAL LOW (ref 10–24)
BUN: 10 mg/dL (ref 8–27)
Bilirubin Total: 0.9 mg/dL (ref 0.0–1.2)
CO2: 24 mmol/L (ref 20–29)
Calcium: 9.2 mg/dL (ref 8.6–10.2)
Chloride: 104 mmol/L (ref 96–106)
Creatinine, Ser: 1.1 mg/dL (ref 0.76–1.27)
GFR calc Af Amer: 79 mL/min/{1.73_m2} (ref >59)
GFR calc non Af Amer: 69 mL/min/{1.73_m2} (ref >59)
Globulin, Total: 2.6 g/dL (ref 1.5–4.5)
Glucose: 90 mg/dL (ref 65–99)
Potassium: 4.6 mmol/L (ref 3.5–5.2)
Sodium: 141 mmol/L (ref 134–144)
Total Protein: 7 g/dL (ref 6.0–8.5)

## 2021-02-04 ENCOUNTER — Ambulatory Visit
Admission: RE | Admit: 2021-02-04 | Discharge: 2021-02-04 | Disposition: A | Discharge status: Home / Self Care | Payer: Medicare PPO | Source: Ambulatory Visit | Attending: Unknown Physician Specialty | Admitting: Unknown Physician Specialty

## 2021-02-04 ENCOUNTER — Other Ambulatory Visit: Payer: Self-pay

## 2021-02-04 DIAGNOSIS — H90A22 Sensorineural hearing loss, unilateral, left ear, with restricted hearing on the contralateral side: Secondary | ICD-10-CM | POA: Insufficient documentation

## 2021-02-04 IMAGING — MR MR BRAIN/IAC WO/W CM
11 of 15 series · 28 of 48 positions shown · IV contrast (gadavist)
Comparison: No pertinent prior exams available for comparison.

CLINICAL DATA: Sensorineural hearing loss, unilateral, left ear,
with unrestricted hearing on the contralateral side. Additional
history provided by scanning technologist: Patient reports left ear
hearing loss for several years.

EXAM:
MRI HEAD WITHOUT AND WITH CONTRAST
TECHNIQUE: Multiplanar, multiecho pulse sequences of the brain and surrounding
structures were obtained without and with intravenous contrast.
CONTRAST:  9mL GADAVIST GADOBUTROL 1 MMOL/ML IV SOLN

[Series 5: T1 · sagittal · 5.0mm · 0.62mm/px · 1 of 21 slices shown (1 of 3)]
[im 1/21]
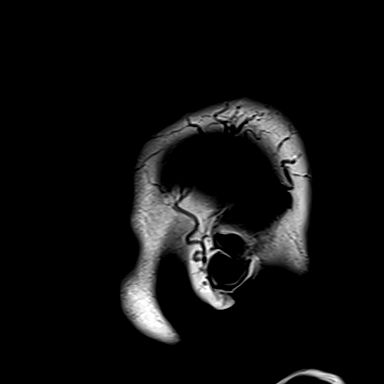

[Series 6: ax dwi_tracew · axial · 3.0mm · 0.60mm/px · z∈[-79,+75]mm · 2 of 48 slices shown]
[im 1/48]
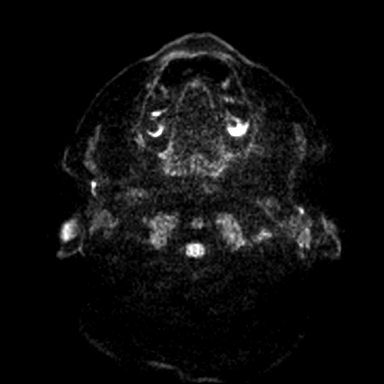
[im 48/48]
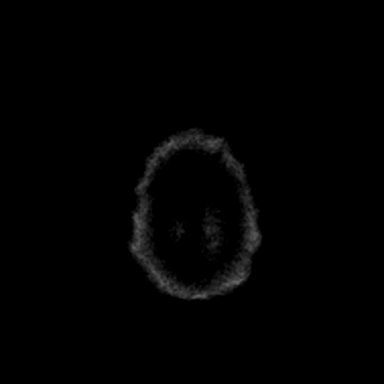

[Series 7: ax dwi_adc · axial · 3.0mm · 0.60mm/px · z∈[-79,+75]mm · 3 of 48 slices shown]
[im 1/48]
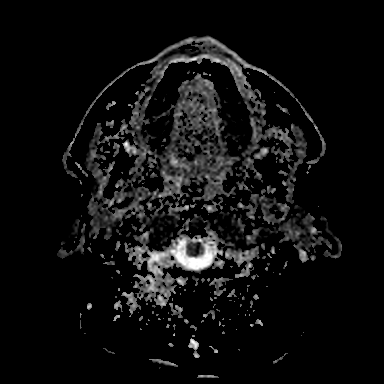
[im 24/48]
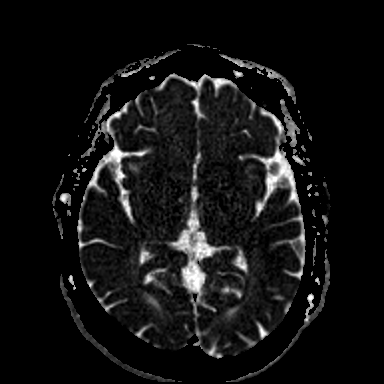
[im 48/48]
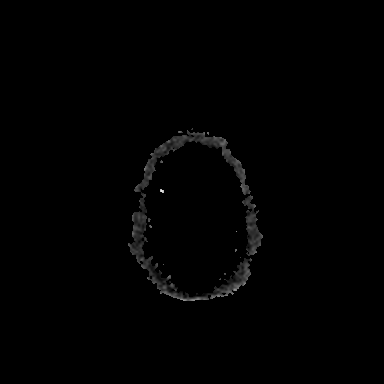

[Series 8: T2 · axial · 5.0mm · 0.53mm/px · z∈[-74,+69]mm · 2 of 25 slices shown]
[im 1/25]
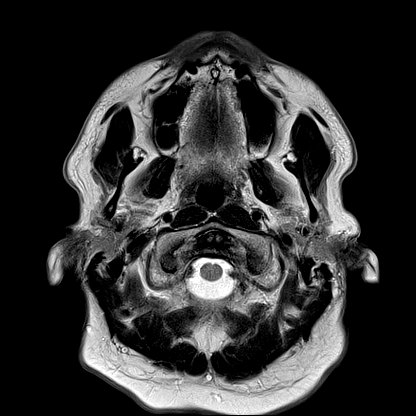
[im 25/25]
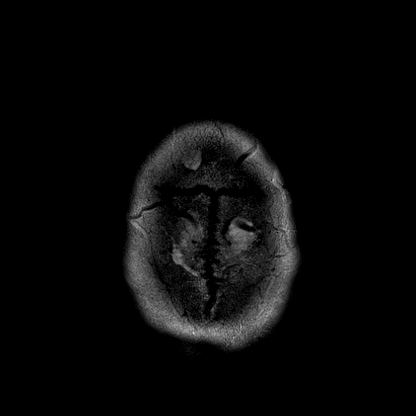

[Series 9: mag_images · axial · 3.0mm · 0.90mm/px · z∈[-90,+26]mm · 3 of 60 slices shown]
[im 1/60]
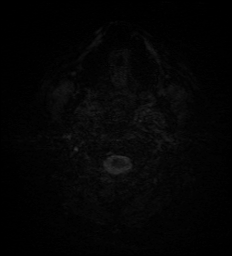
[im 20/60]
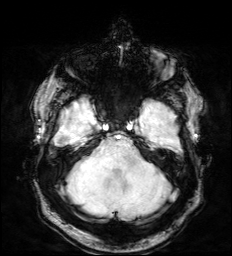
[im 40/60]
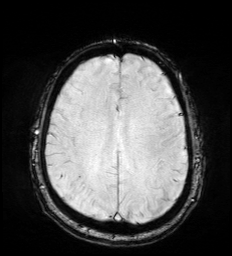

[Series 13: T1 · coronal · non-contrast · 3.0mm · 0.21mm/px · 1 of 13 slices shown (2 of 3)]
[im 1/13]
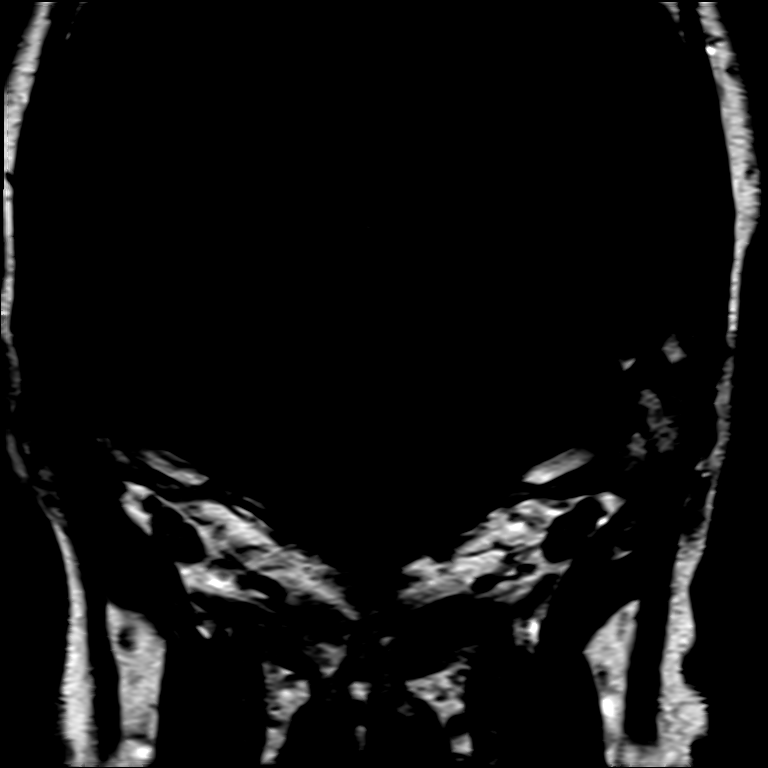

[Series 14: FLAIR · axial · 3.0mm · 0.53mm/px · z∈[-83,+78]mm · 4 of 55 slices shown]
[im 1/55]
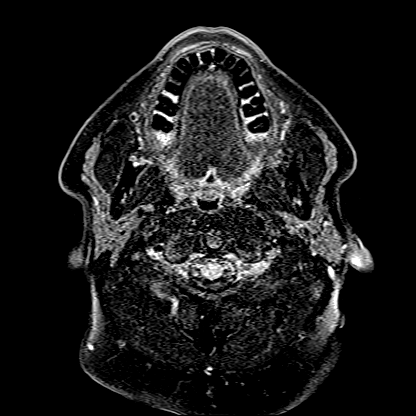
[im 19/55]
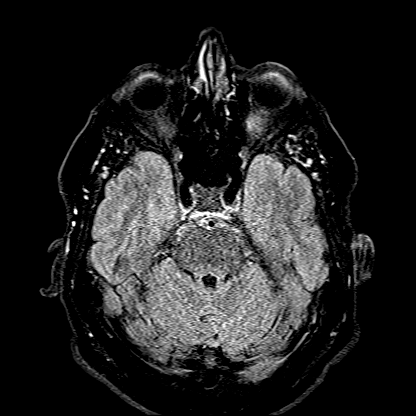
[im 37/55]
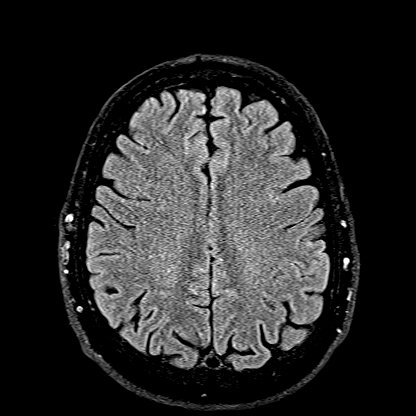
[im 55/55]
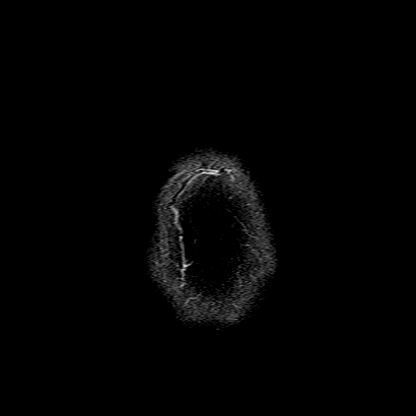

[Series 16: T1 · axial · non-contrast · 3.0mm · 0.21mm/px · 1 of 15 slices shown (3 of 3)]
[im 1/15]
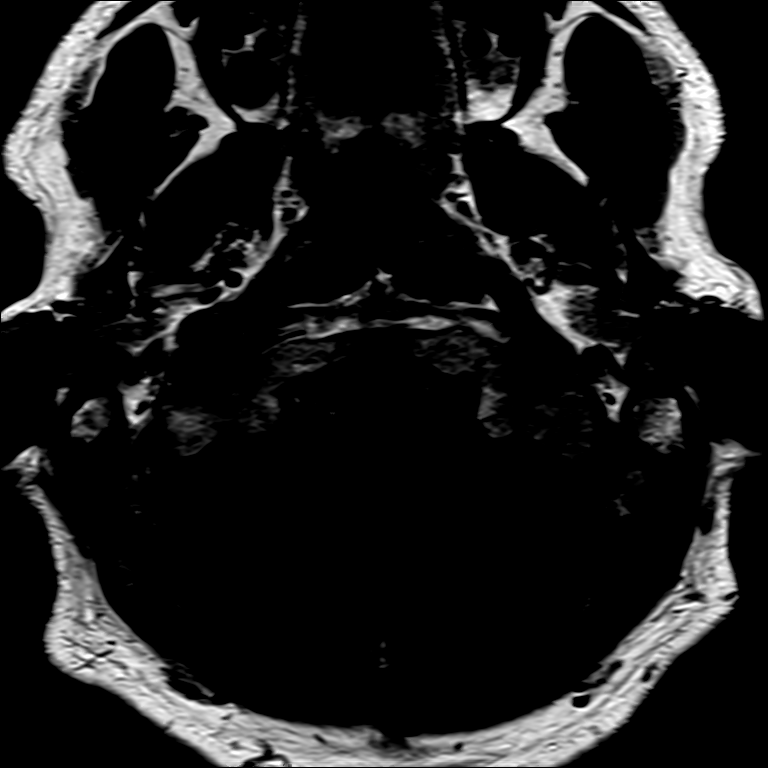

[Series 17: T1 post-contrast · axial · 3.0mm · 0.21mm/px · 1 of 15 slices shown (1 of 3)]
[im 1/15]
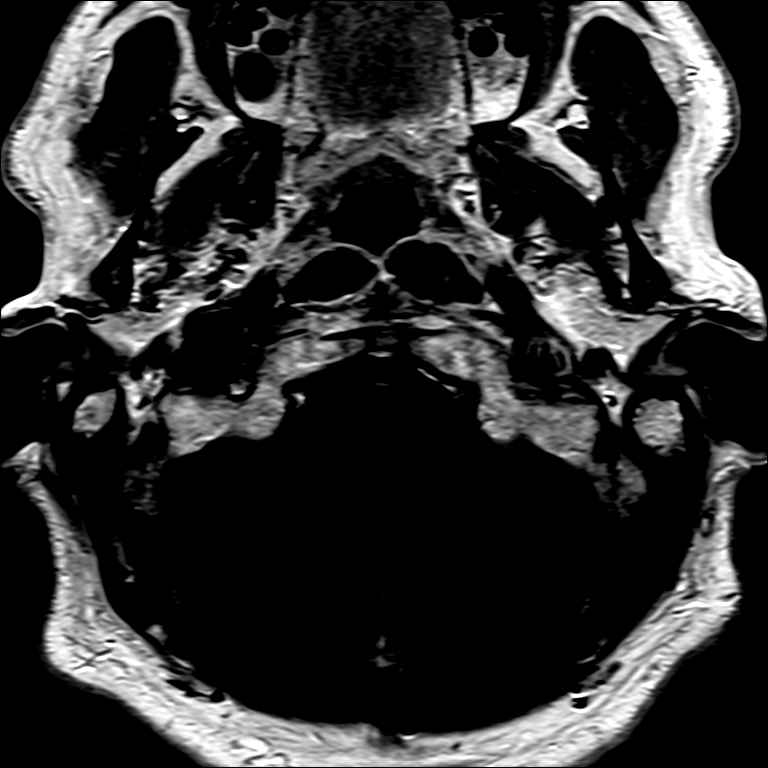

[Series 18: T1 post-contrast · coronal · 3.0mm · 0.21mm/px · 1 of 13 slices shown (2 of 3)]
[im 1/13]
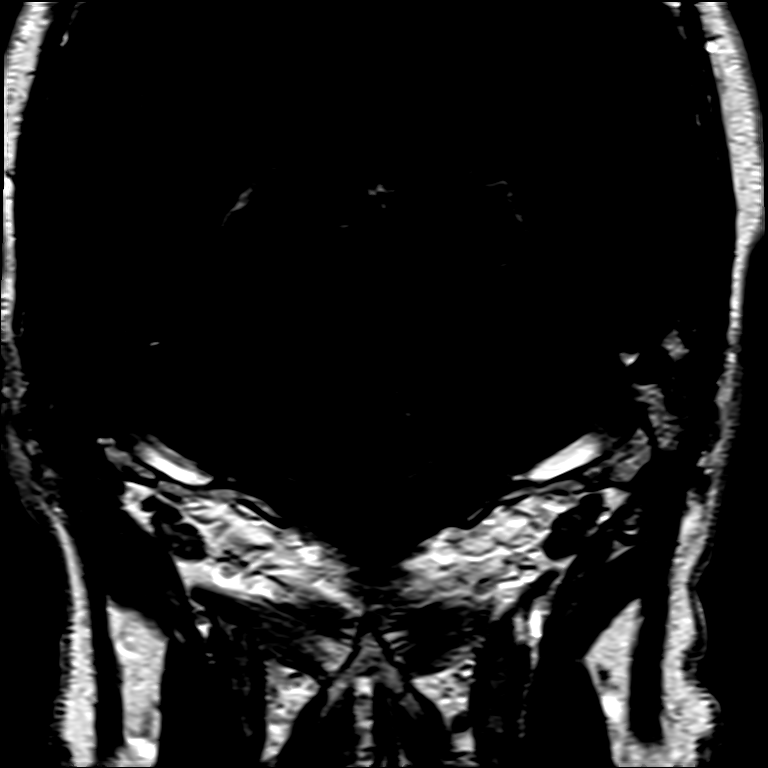

[Series 19: T1 post-contrast · axial · 1.0mm · 0.98mm/px · z∈[-87,+86]mm · 9 of 176 slices shown (3 of 3)]
[im 1/176]
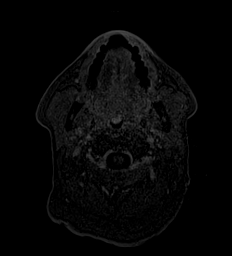
[im 32/176]
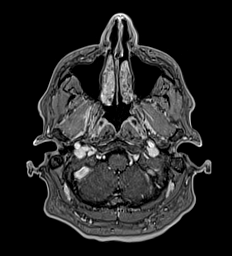
[im 48/176]
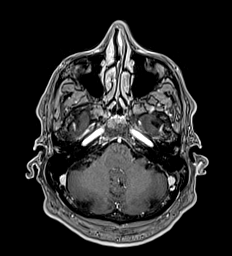
[im 80/176]
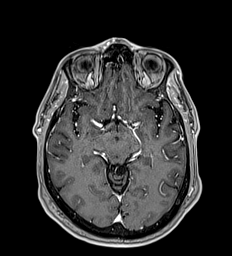
[im 96/176]
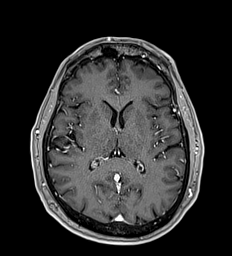
[im 128/176]
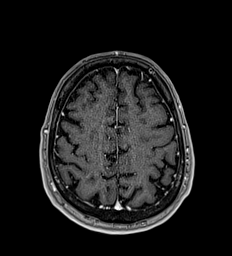
[im 144/176]
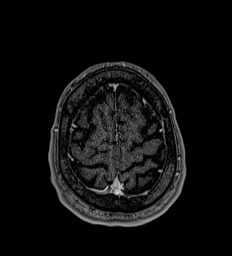
[im 160/176]
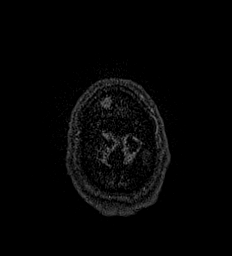
[im 176/176]
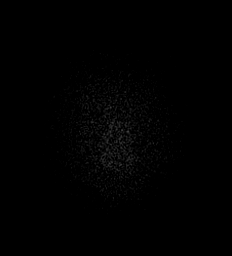

[28 of 48 positions shown; findings below may reference images not displayed]

FINDINGS: Brain:

Cerebral volume is normal for age.

No evidence of an intracranial mass. Specifically, no
cerebellopontine angle or internal auditory canal mass is
demonstrated. Unremarkable appearance of the seventh and eighth
cranial nerves bilaterally.

No cortical encephalomalacia is identified. No significant white
matter disease.

There is no acute infarct.

No evidence of intracranial mass.

No chronic intracranial blood products.

No extra-axial fluid collection.

No midline shift.

No abnormal intracranial enhancement.

Vascular: Expected proximal arterial flow voids.

Skull and upper cervical spine: Nonspecific heterogeneous calvarial
marrow signal with bilateral ill-defined regions of T1
hypointensity.

Sinuses/Orbits: Visualized orbits show no acute finding. Trace
bilateral ethmoid sinus mucosal thickening.
IMPRESSION: No cerebellopontine angle or internal auditory canal mass.

Unremarkable MRI appearance of the brain for age. No evidence of
acute intracranial abnormality.

Nonspecific heterogeneous calvarial marrow signal.

## 2021-02-04 MED ORDER — GADOBUTROL 1 MMOL/ML IV SOLN
9.0000 mL | Freq: Once | INTRAVENOUS | Status: AC | PRN
  Administered 2021-02-04: 9 mL via INTRAVENOUS

## 2021-02-18 ENCOUNTER — Telehealth: Payer: Self-pay | Admitting: Family Medicine

## 2021-02-18 NOTE — Telephone Encounter (Signed)
Copied from [place] [id number]. Topic: Medicare AWV >> Feb 18, 2021  2:21 PM Cher Nakai R wrote: Reason for CRM: Left message for patient to call back and schedule Medicare Annual Wellness Visit (AWV) in office.   If not able to come in office, please offer to do virtually or by telephone.   Last AWV:08/18/2018  Please schedule at anytime with [facility] Health Advisor.  If any questions, please contact me at [phone]

## 2021-07-31 ENCOUNTER — Ambulatory Visit: Payer: Self-pay | Admitting: Family Medicine

## 2021-09-04 ENCOUNTER — Telehealth: Payer: Self-pay | Admitting: Gastroenterology

## 2021-09-04 NOTE — Telephone Encounter (Signed)
Pt. Calling to schedule endoscopy.. I let him know he needed to schedule an appt first but he insisted that he is used to getting it done without an appt. Requesting a return call

## 2021-09-09 ENCOUNTER — Other Ambulatory Visit: Payer: Self-pay

## 2021-09-09 DIAGNOSIS — K22719 Barrett's esophagus with dysplasia, unspecified: Secondary | ICD-10-CM

## 2021-09-09 NOTE — Telephone Encounter (Signed)
Returned pt's call regarding scheduling his EGD. Advised pt due to insurance, he will need follow up appts with Dr. Allen Norris so when we submit the pre certification request, it won't be denied. Pt scheduled for EGD on 10/07/21 and office appt on 10/17/21

## 2021-09-19 ENCOUNTER — Encounter: Payer: Self-pay | Admitting: Gastroenterology

## 2021-10-07 ENCOUNTER — Encounter: Payer: Self-pay | Admitting: Gastroenterology

## 2021-10-07 ENCOUNTER — Encounter
Admission: RE | Disposition: A | Discharge status: Home / Self Care | Payer: Self-pay | Source: Home / Self Care | Attending: Gastroenterology

## 2021-10-07 ENCOUNTER — Ambulatory Visit: Payer: Medicare PPO | Admitting: Anesthesiology

## 2021-10-07 ENCOUNTER — Other Ambulatory Visit: Payer: Self-pay

## 2021-10-07 ENCOUNTER — Ambulatory Visit
Admission: RE | Admit: 2021-10-07 | Discharge: 2021-10-07 | Disposition: A | Discharge status: Home / Self Care | Payer: Medicare PPO | Attending: Gastroenterology | Admitting: Gastroenterology

## 2021-10-07 DIAGNOSIS — Z09 Encounter for follow-up examination after completed treatment for conditions other than malignant neoplasm: Secondary | ICD-10-CM | POA: Diagnosis present

## 2021-10-07 DIAGNOSIS — K2270 Barrett's esophagus without dysplasia: Secondary | ICD-10-CM | POA: Diagnosis not present

## 2021-10-07 DIAGNOSIS — K22719 Barrett's esophagus with dysplasia, unspecified: Secondary | ICD-10-CM | POA: Diagnosis not present

## 2021-10-07 DIAGNOSIS — Z8719 Personal history of other diseases of the digestive system: Secondary | ICD-10-CM | POA: Insufficient documentation

## 2021-10-07 HISTORY — PX: ESOPHAGOGASTRODUODENOSCOPY (EGD) WITH PROPOFOL: SHX5813

## 2021-10-07 SURGERY — ESOPHAGOGASTRODUODENOSCOPY (EGD) WITH PROPOFOL
Anesthesia: General | Site: Throat

## 2021-10-07 MED ORDER — PROPOFOL 10 MG/ML IV BOLUS
INTRAVENOUS | Status: DC | PRN
  Administered 2021-10-07 (×3): 40 mg via INTRAVENOUS
  Administered 2021-10-07: 20 mg via INTRAVENOUS
  Administered 2021-10-07: 150 mg via INTRAVENOUS

## 2021-10-07 MED ORDER — SODIUM CHLORIDE 0.9 % IV SOLN: INTRAVENOUS | Status: DC

## 2021-10-07 MED ORDER — STERILE WATER FOR IRRIGATION IR SOLN
Status: DC | PRN
  Administered 2021-10-07: 250 mL

## 2021-10-07 MED ORDER — ACETAMINOPHEN 160 MG/5ML PO SOLN: 325.0000 mg | Freq: Once | ORAL | Status: DC

## 2021-10-07 MED ORDER — GLYCOPYRROLATE 0.2 MG/ML IJ SOLN
INTRAMUSCULAR | Status: DC | PRN
  Administered 2021-10-07: .1 mg via INTRAVENOUS

## 2021-10-07 MED ORDER — ACETAMINOPHEN 325 MG PO TABS: 325.0000 mg | ORAL_TABLET | Freq: Once | ORAL | Status: DC

## 2021-10-07 MED ORDER — LIDOCAINE HCL (CARDIAC) PF 100 MG/5ML IV SOSY
PREFILLED_SYRINGE | INTRAVENOUS | Status: DC | PRN
  Administered 2021-10-07: 50 mg via INTRAVENOUS

## 2021-10-07 MED ORDER — LACTATED RINGERS IV SOLN: INTRAVENOUS | Status: DC

## 2021-10-07 SURGICAL SUPPLY — 8 items
BLOCK BITE 60FR ADLT L/F GRN (MISCELLANEOUS) ×2 IMPLANT
FORCEPS BIOP RAD 4 LRG CAP 4 (CUTTING FORCEPS) ×2 IMPLANT
GOWN CVR UNV OPN BCK APRN NK (MISCELLANEOUS) ×2 IMPLANT
GOWN ISOL THUMB LOOP REG UNIV (MISCELLANEOUS) ×4
KIT PRC NS LF DISP ENDO (KITS) ×1 IMPLANT
KIT PROCEDURE OLYMPUS (KITS) ×2
MANIFOLD NEPTUNE II (INSTRUMENTS) ×2 IMPLANT
WATER STERILE IRR 250ML POUR (IV SOLUTION) ×2 IMPLANT

## 2021-10-07 NOTE — Transfer of Care (Signed)
Immediate Anesthesia Transfer of Care Note  Patient: Lucas Christensen  Procedure(s) Performed: ESOPHAGOGASTRODUODENOSCOPY (EGD) WITH PROPOFOL (Throat)  Patient Location: PACU  Anesthesia Type: General  Level of Consciousness: awake, alert  and patient cooperative  Airway and Oxygen Therapy: Patient Spontanous Breathing and Patient connected to supplemental oxygen  Post-op Assessment: Post-op Vital signs reviewed, Patient's Cardiovascular Status Stable, Respiratory Function Stable, Patent Airway and No signs of Nausea or vomiting  Post-op Vital Signs: Reviewed and stable  Complications: No notable events documented.

## 2021-10-07 NOTE — Anesthesia Procedure Notes (Signed)
Date/Time: 10/07/2021 9:39 AM Performed by: Cameron Ali, CRNA Pre-anesthesia Checklist: Patient identified, Emergency Drugs available, Suction available, Timeout performed and Patient being monitored Patient Re-evaluated:Patient Re-evaluated prior to induction Oxygen Delivery Method: Nasal cannula Placement Confirmation: positive ETCO2

## 2021-10-07 NOTE — H&P (Signed)
Lucilla Lame, MD [address] [place], [place] [address] Phone:[phone] Fax : [phone]  Primary Care Physician:  Jerrol Banana., MD Primary Gastroenterologist:  Dr. Allen Norris  Pre-Procedure History & Physical: HPI:  Lucas Christensen is a 70 y.o. male is here for an endoscopy.   Past Medical History:  Diagnosis Date   Arthritis    left shoulder   Barrett esophagus    BPH (benign prostatic hyperplasia)    Calculus of both kidneys    Chronic prostatitis    Elevated PSA    GERD (gastroesophageal reflux disease)    Hiatal hernia    Hyperlipidemia    Prostatitis    Squamous cell skin cancer    Testicular hypofunction    Wears glasses     Past Surgical History:  Procedure Laterality Date   COLONOSCOPY WITH PROPOFOL N/A 09/13/2018   Procedure: COLONOSCOPY WITH PROPOFOL;  Surgeon: Lucilla Lame, MD;  Location: [facility];  Service: Endoscopy;  Laterality: N/A;   ESOPHAGOGASTRODUODENOSCOPY (EGD) WITH PROPOFOL N/A 08/16/2020   Procedure: ESOPHAGOGASTRODUODENOSCOPY (EGD) WITH PROPOFOL;  Surgeon: Lucilla Lame, MD;  Location: [facility];  Service: Endoscopy;  Laterality: N/A;   HERNIA REPAIR Left 2005   left inguinal   PROSTATE SURGERY     SHOULDER SURGERY Left     Prior to Admission medications   Medication Sig Start Date End Date Taking? Authorizing Provider  Ascorbic Acid (VITAMIN C) 1000 MG tablet Take by mouth.   Yes [provider]  cholecalciferol (VITAMIN D) 1000 units tablet Take 1,000 Units by mouth daily.   Yes [provider]  Multiple Vitamin (MULTIVITAMIN) tablet Take 1 tablet by mouth daily.   Yes [provider]  niacin 500 MG CR capsule Take by mouth. 06/08/07  Yes [provider]  pantoprazole (PROTONIX) 40 MG tablet Take 1 tablet (40 mg total) by mouth daily. Patient taking differently: Take 40 mg by mouth 2 (two) times daily. 08/16/20 10/07/21 Yes Lucilla Lame, MD  rosuvastatin (CRESTOR) 20  MG tablet Take 1 tablet (20 mg total) by mouth daily. 12/25/20  Yes Jerrol Banana., MD  tadalafil (CIALIS) 5 MG tablet Take 1 tablet (5 mg total) by mouth daily as needed for erectile dysfunction. 01/09/21  Yes Stoioff, Ronda Fairly, MD  vitamin E 1000 UNIT capsule Take by mouth.   Yes [provider]  pantoprazole (PROTONIX) 40 MG tablet Take by mouth. 11/06/20   [provider]    Allergies as of 09/09/2021 - Review Complete 01/31/2021  Allergen Reaction Noted   Flomax [tamsulosin]  10/10/2019    Family History  Problem Relation Age of Onset   Hypertension Mother    Heart attack Mother    Lung cancer Mother    Arthritis Father    Diabetes Father    Hypertension Sister    Multiple myeloma Sister    Diabetes Maternal Grandmother     Social History   Socioeconomic History   Marital status: Married    Spouse name: Not on file   Number of children: Not on file   Years of education: Not on file   Highest education level: Not on file  Occupational History   Not on file  Tobacco Use   Smoking status: Never   Smokeless tobacco: Never  Vaping Use   Vaping Use: Never used  Substance and Sexual Activity   Alcohol use: No    Comment: occasional wine on Holidays   Drug use:  No   Sexual activity: Not on file  Other Topics Concern   Not on file  Social History Narrative   Not on file   Social Determinants of Health   Financial Resource Strain: Not on file  Food Insecurity: Not on file  Transportation Needs: Not on file  Physical Activity: Not on file  Stress: Not on file  Social Connections: Not on file  Intimate Partner Violence: Not on file    Review of Systems: See HPI, otherwise negative ROS  Physical Exam: BP 124/82   Pulse (!) 48   Temp 97.7 F (36.5 C) (Oral)   Resp 16   Ht _0  (1.778 m)   Wt 95.8 kg   SpO2 95%   BMI 30.29 kg/m  General:   Alert,  pleasant and cooperative in NAD Head:  Normocephalic and atraumatic. Neck:   Supple; no masses or thyromegaly. Lungs:  Clear throughout to auscultation.    Heart:  Regular rate and rhythm. Abdomen:  Soft, nontender and nondistended. Normal bowel sounds, without guarding, and without rebound.   Neurologic:  Alert and  oriented x4;  grossly normal neurologically.  Impression/Plan: DACEN FRAYRE is here for an endoscopy to be performed for barrett's esophagus  Risks, benefits, limitations, and alternatives regarding  endoscopy have been reviewed with the patient.  Questions have been answered.  All parties agreeable.   Lucilla Lame, MD  10/07/2021, 9:30 AM

## 2021-10-07 NOTE — Op Note (Signed)
[facility] Gastroenterology Patient Name: Lucas Christensen Procedure Date: 10/07/2021 9:26 AM MRN: [id number] Account #: [id number] Date of Birth: 02-24-1951 Admit Type: Outpatient Age: 70 Room: [facility] OR ROOM 01 Gender: Male Note Status: Finalized Instrument Name: [id number] Procedure:             Upper GI endoscopy Indications:           Surveillance for malignancy due to personal history of                         Barrett's esophagus Providers:             Lucilla Lame MD, MD Referring MD:          Janine Ores. Rosanna Randy, MD (Referring MD) Medicines:             Propofol per Anesthesia Complications:         No immediate complications. Procedure:             Pre-Anesthesia Assessment:                        - Prior to the procedure, a History and Physical was                         performed, and patient medications and allergies were                         reviewed. The patient's tolerance of previous                         anesthesia was also reviewed. The risks and benefits                         of the procedure and the sedation options and risks                         were discussed with the patient. All questions were                         answered, and informed consent was obtained. Prior                         Anticoagulants: The patient has taken no previous                         anticoagulant or antiplatelet agents. ASA Grade                         Assessment: II - A patient with mild systemic disease.                         After reviewing the risks and benefits, the patient                         was deemed in satisfactory condition to undergo the                         procedure.  After obtaining informed consent, the endoscope was                         passed under direct vision. Throughout the procedure,                         the patient's blood pressure, pulse, and oxygen                         saturations were  monitored continuously. The Endoscope                         was introduced through the mouth, and advanced to the                         second part of duodenum. The upper GI endoscopy was                         accomplished without difficulty. The patient tolerated                         the procedure well. Findings:      The esophagus and gastroesophageal junction were examined with white       light and narrow band imaging (NBI). There were esophageal mucosal       changes secondary to established long-segment Barrett's disease,       classified as Barrett's stage C11-M11 per Prague criteria. These changes       involved the mucosa at the upper extent of the gastric folds (35 cm from       the incisors) extending to the Z-line (24 cm from the incisors).       Circumferential salmon-colored mucosa was present from 24 to 35 cm. The       maximum longitudinal extent of these esophageal mucosal changes was 11       cm in length. Mucosa was biopsied with a cold forceps for histology. A       total of 9 specimen bottles were sent to pathology.      The stomach was normal.      The examined duodenum was normal. Impression:            - Esophageal mucosal changes secondary to established                         long-segment Barrett's disease. Biopsied.                        - Normal stomach.                        - Normal examined duodenum. Recommendation:        - Discharge patient to home.                        - Resume previous diet.                        - Continue present medications.                        - Await pathology results.                        -  Repeat upper endoscopy in 3 years for surveillance. Procedure Code(s):     --- Professional ---                        [phone], Esophagogastroduodenoscopy, flexible,                         transoral; with biopsy, single or multiple Diagnosis Code(s):     --- Professional ---                        K22.70, Barrett's  esophagus without dysplasia CPT copyright 2019 American Medical Association. All rights reserved. The codes documented in this report are preliminary and upon coder review may  be revised to meet current compliance requirements. Lucilla Lame MD, MD 10/07/2021 10:02:14 AM This report has been signed electronically. Number of Addenda: 0 Note Initiated On: 10/07/2021 9:26 AM Total Procedure Duration: 0 hours 16 minutes 10 seconds  Estimated Blood Loss:  Estimated blood loss: none. Estimated blood loss: none.      [facility]

## 2021-10-07 NOTE — Anesthesia Postprocedure Evaluation (Signed)
Anesthesia Post Note  Patient: Lucas Christensen  Procedure(s) Performed: ESOPHAGOGASTRODUODENOSCOPY (EGD) WITH PROPOFOL (Throat)     Patient location during evaluation: PACU Anesthesia Type: General Level of consciousness: awake and alert and oriented Pain management: satisfactory to patient Vital Signs Assessment: post-procedure vital signs reviewed and stable Respiratory status: spontaneous breathing, nonlabored ventilation and respiratory function stable Cardiovascular status: blood pressure returned to baseline and stable Postop Assessment: Adequate PO intake and No signs of nausea or vomiting Anesthetic complications: no   No notable events documented.  Raliegh Ip

## 2021-10-07 NOTE — Anesthesia Preprocedure Evaluation (Signed)
Anesthesia Evaluation  Patient identified by MRN, date of birth, ID band Patient awake    Reviewed: Allergy & Precautions, H&P , NPO status , Patient's Chart, lab work & pertinent test results  Airway Mallampati: II  TM Distance: >3 FB Neck ROM: full    Dental no notable dental hx.    Pulmonary    Pulmonary exam normal breath sounds clear to auscultation       Cardiovascular hypertension, Normal cardiovascular exam Rhythm:regular Rate:Normal     Neuro/Psych    GI/Hepatic GERD  ,  Endo/Other    Renal/GU      Musculoskeletal   Abdominal   Peds  Hematology   Anesthesia Other Findings   Reproductive/Obstetrics                             Anesthesia Physical Anesthesia Plan  ASA: 2  Anesthesia Plan: General   Post-op Pain Management:    Induction: Intravenous  PONV Risk Score and Plan: 2 and Treatment may vary due to age or medical condition, Propofol infusion and TIVA  Airway Management Planned: Natural Airway  Additional Equipment:   Intra-op Plan:   Post-operative Plan:   Informed Consent: I have reviewed the patients History and Physical, chart, labs and discussed the procedure including the risks, benefits and alternatives for the proposed anesthesia with the patient or authorized representative who has indicated his/her understanding and acceptance.     Dental Advisory Given  Plan Discussed with: CRNA  Anesthesia Plan Comments:         Anesthesia Quick Evaluation

## 2021-10-09 ENCOUNTER — Encounter: Payer: Self-pay | Admitting: Gastroenterology

## 2021-10-09 LAB — SURGICAL PATHOLOGY

## 2021-10-21 ENCOUNTER — Ambulatory Visit (INDEPENDENT_AMBULATORY_CARE_PROVIDER_SITE_OTHER): Payer: Medicare PPO | Admitting: Gastroenterology

## 2021-10-21 ENCOUNTER — Other Ambulatory Visit: Payer: Self-pay

## 2021-10-21 ENCOUNTER — Encounter: Payer: Self-pay | Admitting: Gastroenterology

## 2021-10-21 VITALS — BP 129/78 | HR 88 | Temp 98.1°F | Wt 215.0 lb

## 2021-10-21 DIAGNOSIS — K22719 Barrett's esophagus with dysplasia, unspecified: Secondary | ICD-10-CM | POA: Diagnosis not present

## 2021-10-21 DIAGNOSIS — K219 Gastro-esophageal reflux disease without esophagitis: Secondary | ICD-10-CM | POA: Diagnosis not present

## 2021-10-21 NOTE — Progress Notes (Signed)
Primary Care Physician: Jerrol Banana., MD  Primary Gastroenterologist:  Dr. Lucilla Lame  Chief Complaint  Patient presents with   Follow-up    HPI: Lucas Christensen is a 70 y.o. male here for follow-up after having an EGD for his Barrett's.  The patient had biopsies of the Barrett's esophagus which showed no dysplasia.  The patient was evaluated for Barrett's ablation at [facility] and they recommended that since he does not have any sign of dysplasia that they would not recommend ablation saying that the risks outweigh the benefits.  The patient was told that he can have antireflux surgery and that would not preclude him from having any ablation in the future if he should develop dysplasia. The patient came to talk today about his options as far as continuing to take the medication versus antireflux surgery versus reconsidering ablation therapy of his Barrett's esophagus  Past Medical History:  Diagnosis Date   Arthritis    left shoulder   Barrett esophagus    BPH (benign prostatic hyperplasia)    Calculus of both kidneys    Chronic prostatitis    Elevated PSA    GERD (gastroesophageal reflux disease)    Hiatal hernia    Hyperlipidemia    Prostatitis    Squamous cell skin cancer    Testicular hypofunction    Wears glasses     Current Outpatient Medications  Medication Sig Dispense Refill   Ascorbic Acid (VITAMIN C) 1000 MG tablet Take by mouth.     cholecalciferol (VITAMIN D) 1000 units tablet Take 1,000 Units by mouth daily.     Multiple Vitamin (MULTIVITAMIN) tablet Take 1 tablet by mouth daily.     niacin 500 MG CR capsule Take by mouth.     rosuvastatin (CRESTOR) 20 MG tablet Take 1 tablet (20 mg total) by mouth daily. 90 tablet 3   tadalafil (CIALIS) 5 MG tablet Take 1 tablet (5 mg total) by mouth daily as needed for erectile dysfunction. 90 tablet 3   vitamin E 1000 UNIT capsule Take by mouth.     pantoprazole (PROTONIX) 40 MG tablet Take 1 tablet (40 mg total) by  mouth daily. (Patient taking differently: Take 40 mg by mouth 2 (two) times daily.) 30 tablet 11   No current facility-administered medications for this visit.    Allergies as of 10/21/2021 - Review Complete 10/21/2021  Allergen Reaction Noted   Flomax [tamsulosin]  10/10/2019    ROS:  General: Negative for anorexia, weight loss, fever, chills, fatigue, weakness. ENT: Negative for hoarseness, difficulty swallowing , nasal congestion. CV: Negative for chest pain, angina, palpitations, dyspnea on exertion, peripheral edema.  Respiratory: Negative for dyspnea at rest, dyspnea on exertion, cough, sputum, wheezing.  GI: See history of present illness. GU:  Negative for dysuria, hematuria, urinary incontinence, urinary frequency, nocturnal urination.  Endo: Negative for unusual weight change.    Physical Examination:   BP 129/78   Pulse 88   Temp 98.1 F (36.7 C) (Oral)   Wt 215 lb (97.5 kg)   BMI 30.85 kg/m   General: Well-nourished, well-developed in no acute distress.  Eyes: No icterus. Conjunctivae pink. Neuro: Alert and oriented x 3.  Grossly intact. Skin: Warm and dry, no jaundice.   Psych: Alert and cooperative, normal mood and affect.  Labs:    Imaging Studies: No results found.  Assessment and Plan:   Lucas Christensen is a 70 y.o. y/o male who has a history of long segment  Barrett's all the way up to the mid esophagus.  The patient states that he can't eat after 3:00 because he will have reflux although he does not feel heartburn. The patient has been told the risks and benefits of all his options including surgery and its long-term implications and the same goes for continuing medication.  He has also been told the risks of doing nothing including continued reflux with regurgitation.  The patient states he would like to think about his options and will contact me with what he decides.     Lucilla Lame, MD. Marval Regal    Note: This dictation was prepared with Dragon  dictation along with smaller phrase technology. Any transcriptional errors that result from this process are unintentional.

## 2021-10-23 ENCOUNTER — Other Ambulatory Visit: Payer: Self-pay

## 2021-10-24 ENCOUNTER — Telehealth: Payer: Self-pay | Admitting: Gastroenterology

## 2021-10-24 NOTE — Telephone Encounter (Signed)
Inbound call from pt requesting a refill for Pantoprazole. Please advise

## 2021-10-25 ENCOUNTER — Ambulatory Visit: Payer: Medicare PPO | Admitting: Urology

## 2021-10-25 ENCOUNTER — Other Ambulatory Visit: Payer: Self-pay

## 2021-10-25 ENCOUNTER — Encounter: Payer: Self-pay | Admitting: Urology

## 2021-10-25 VITALS — BP 139/86 | HR 59 | Ht 70.0 in | Wt 209.0 lb

## 2021-10-25 DIAGNOSIS — N411 Chronic prostatitis: Secondary | ICD-10-CM

## 2021-10-25 DIAGNOSIS — N401 Enlarged prostate with lower urinary tract symptoms: Secondary | ICD-10-CM | POA: Diagnosis not present

## 2021-10-25 DIAGNOSIS — R350 Frequency of micturition: Secondary | ICD-10-CM

## 2021-10-25 LAB — URINALYSIS, COMPLETE
Bilirubin, UA: NEGATIVE
Glucose, UA: NEGATIVE
Ketones, UA: NEGATIVE
Nitrite, UA: NEGATIVE
Protein,UA: NEGATIVE
RBC, UA: NEGATIVE
Specific Gravity, UA: >1.03 — ABNORMAL HIGH (ref 1.005–1.030)
Urobilinogen, Ur: 0.2 mg/dL (ref 0.2–1.0)
pH, UA: 5 (ref 5.0–7.5)

## 2021-10-25 LAB — MICROSCOPIC EXAMINATION: Bacteria, UA: NONE SEEN

## 2021-10-25 MED ORDER — PANTOPRAZOLE SODIUM 40 MG PO TBEC: 40.0000 mg | DELAYED_RELEASE_TABLET | Freq: Two times a day (BID) | ORAL | 11 refills | Status: DC

## 2021-10-25 NOTE — Progress Notes (Signed)
10/25/2021 12:33 PM   Lucas Christensen [birth year] [id number]  Referring provider: Jerrol Banana., MD [address] [place],  [place] [address]  Chief Complaint  Patient presents with   Urinary Frequency    Urologic history:  1.  History of stone disease - status post left ureteroscopic stone removal May 2014   2.  History elevated PSA -Baseline PSA upper 1/low 2 range; intermittent elevation as high as 6.1 -No previous biopsy   3.  BPH with lower urinary tract symptoms -Status post UroLift in [place] 2018 -On tadalafil 5 mg daily for mild to moderate LUTS -Unable to tolerate alpha-blocker secondary to orthostatic symptoms   4.  Episode acute prostatitis September 2019   HPI: 70 y.o. male scheduled for annual follow-up February 2023 but called for an earlier visit due to increasing frequency, nocturia and mild dysuria  Symptoms started ~ 1 month ago with increased frequency and nocturia every 2 hours Mild dysuria at times No gross hematuria Over the past week he is symptoms have markedly improved and he is currently back to baseline His brother-in-law recently diagnosed with stage IV prostate cancer and he is concerned about the possibility of prostate cancer and is requesting a PSA   PMH: Past Medical History:  Diagnosis Date   Arthritis    left shoulder   Barrett esophagus    BPH (benign prostatic hyperplasia)    Calculus of both kidneys    Chronic prostatitis    Elevated PSA    GERD (gastroesophageal reflux disease)    Hiatal hernia    Hyperlipidemia    Prostatitis    Squamous cell skin cancer    Testicular hypofunction    Wears glasses     Surgical History: Past Surgical History:  Procedure Laterality Date   COLONOSCOPY WITH PROPOFOL N/A 09/13/2018   Procedure: COLONOSCOPY WITH PROPOFOL;  Surgeon: Lucilla Lame, MD;  Location: [facility];  Service: Endoscopy;  Laterality: N/A;   ESOPHAGOGASTRODUODENOSCOPY (EGD) WITH  PROPOFOL N/A 08/16/2020   Procedure: ESOPHAGOGASTRODUODENOSCOPY (EGD) WITH PROPOFOL;  Surgeon: Lucilla Lame, MD;  Location: [facility];  Service: Endoscopy;  Laterality: N/A;   ESOPHAGOGASTRODUODENOSCOPY (EGD) WITH PROPOFOL N/A 10/07/2021   Procedure: ESOPHAGOGASTRODUODENOSCOPY (EGD) WITH PROPOFOL;  Surgeon: Lucilla Lame, MD;  Location: [facility];  Service: Endoscopy;  Laterality: N/A;   HERNIA REPAIR Left 2005   left inguinal   PROSTATE SURGERY     SHOULDER SURGERY Left     Home Medications:  Allergies as of 10/25/2021       Reactions   Flomax [tamsulosin]    Dizzy        Medication List        Accurate as of October 25, 2021 12:33 PM. If you have any questions, ask your nurse or doctor.          cholecalciferol 1000 units tablet Commonly known as: VITAMIN D Take 1,000 Units by mouth daily.   multivitamin tablet Take 1 tablet by mouth daily.   niacin 500 MG CR capsule Take by mouth.   pantoprazole 40 MG tablet Commonly known as: Protonix Take 1 tablet (40 mg total) by mouth 2 (two) times daily.   rosuvastatin 20 MG tablet Commonly known as: Crestor Take 1 tablet (20 mg total) by mouth daily.   tadalafil 5 MG tablet Commonly known as: CIALIS Take 1 tablet (5 mg total) by mouth daily as needed for erectile dysfunction.   vitamin C 1000 MG tablet Take by  mouth.   vitamin E 1000 UNIT capsule Take by mouth.        Allergies:  Allergies  Allergen Reactions   Flomax [Tamsulosin]     Dizzy     Family History: Family History  Problem Relation Age of Onset   Hypertension Mother    Heart attack Mother    Lung cancer Mother    Arthritis Father    Diabetes Father    Hypertension Sister    Multiple myeloma Sister    Diabetes Maternal Grandmother     Social History:  reports that he has never smoked. He has never used smokeless tobacco. He reports that he does not drink alcohol and does not use drugs.   Physical Exam: BP  139/86   Pulse (!) 59   Ht '5\' 10"'  (1.778 m)   Wt 209 lb (94.8 kg)   BMI 29.99 kg/m   Constitutional:  Alert and oriented, No acute distress. HEENT: [place] AT, moist mucus membranes.  Trachea midline, no masses. Cardiovascular: No clubbing, cyanosis, or edema. Respiratory: Normal respiratory effort, no increased work of breathing. Psychiatric: Normal mood and affect.  Laboratory Data:  Urinalysis 1+ leuk/6-10 WBC   Assessment & Plan:    1.  BPH with LUTS Frequency and nocturia significantly improved  2.  Chronic prostatitis Urinalysis today with 1+ leukocytes and 6-10 WBCs on microscopy Urine culture ordered  3.  History elevated PSA Would not recommend a PSA today due to pyuria as it may be elevated secondary to inflammation If urine culture positive will recheck PSA after antibiotic treatment If culture negative he was informed his PSA could be drawn at his follow-up in February however he desired to have earlier.  Will schedule lab visit 1 month   Abbie Sons, [place] [address] [place], [place] [address] [phone]

## 2021-10-25 NOTE — Telephone Encounter (Signed)
Rx sent through e-scribe  

## 2021-10-27 ENCOUNTER — Encounter: Payer: Self-pay | Admitting: Urology

## 2021-10-29 ENCOUNTER — Telehealth: Payer: Self-pay | Admitting: *Deleted

## 2021-10-29 LAB — CULTURE, URINE COMPREHENSIVE

## 2021-10-29 NOTE — Telephone Encounter (Signed)
-----   Message from Abbie Sons, MD sent at 10/29/2021  3:53 PM EST ----- Urine culture was negative.  Most likely noninfectious inflammation.  I think he is scheduled for a follow-up PSA next month

## 2021-10-29 NOTE — Telephone Encounter (Signed)
Notified patient as instructed, patient pleased. Discussed follow-up appointments, patient agrees  

## 2021-11-23 ENCOUNTER — Other Ambulatory Visit: Payer: Self-pay | Admitting: Family Medicine

## 2021-11-23 DIAGNOSIS — E782 Mixed hyperlipidemia: Secondary | ICD-10-CM

## 2021-11-24 NOTE — Telephone Encounter (Signed)
Requested Prescriptions  Pending Prescriptions Disp Refills   rosuvastatin (CRESTOR) 20 MG tablet [Pharmacy Med Name: ROSUVASTATIN CALCIUM 20 MG TAB] 90 tablet 0    Sig: TAKE 1 TABLET BY MOUTH EVERY DAY     Cardiovascular:  Antilipid - Statins Passed - 11/23/2021  6:02 AM      Passed - Total Cholesterol in normal range and within 360 days    Cholesterol, Total  Date Value Ref Range Status  01/31/2021 147 100 - 199 mg/dL Final         Passed - LDL in normal range and within 360 days    LDL Chol Calc (NIH)  Date Value Ref Range Status  01/31/2021 71 0 - 99 mg/dL Final         Passed - HDL in normal range and within 360 days    HDL  Date Value Ref Range Status  01/31/2021 63 >39 mg/dL Final         Passed - Triglycerides in normal range and within 360 days    Triglycerides  Date Value Ref Range Status  01/31/2021 67 0 - 149 mg/dL Final         Passed - Patient is not pregnant      Passed - Valid encounter within last 12 months    Recent Outpatient Visits          9 months ago Mixed hyperlipidemia   [facility] Jerrol Banana., MD   11 months ago Acute serous otitis media of left ear, recurrence not specified   [facility] Jerrol Banana., MD   2 years ago Annual physical exam   [facility] Jerrol Banana., MD   2 years ago Benign essential HTN   [facility] Jerrol Banana., MD   3 years ago Medicare welcome exam   [facility] Jerrol Banana., MD      Future Appointments            In 1 month [place], Ronda Fairly, MD [facility]

## 2021-11-25 ENCOUNTER — Other Ambulatory Visit: Payer: Self-pay

## 2021-11-25 DIAGNOSIS — N401 Enlarged prostate with lower urinary tract symptoms: Secondary | ICD-10-CM

## 2021-11-25 DIAGNOSIS — Z87898 Personal history of other specified conditions: Secondary | ICD-10-CM

## 2021-11-26 ENCOUNTER — Other Ambulatory Visit: Payer: Medicare PPO

## 2021-11-26 ENCOUNTER — Other Ambulatory Visit: Payer: Self-pay

## 2021-11-26 DIAGNOSIS — Z87898 Personal history of other specified conditions: Secondary | ICD-10-CM

## 2021-11-26 DIAGNOSIS — N401 Enlarged prostate with lower urinary tract symptoms: Secondary | ICD-10-CM

## 2021-11-27 ENCOUNTER — Encounter: Payer: Self-pay | Admitting: *Deleted

## 2021-11-27 LAB — PSA: Prostate Specific Ag, Serum: 5.8 ng/mL — ABNORMAL HIGH (ref 0.0–4.0)

## 2021-11-28 ENCOUNTER — Other Ambulatory Visit: Payer: Self-pay | Admitting: Urology

## 2021-11-28 DIAGNOSIS — R972 Elevated prostate specific antigen [PSA]: Secondary | ICD-10-CM

## 2021-12-16 ENCOUNTER — Encounter: Payer: Self-pay | Admitting: *Deleted

## 2021-12-20 ENCOUNTER — Ambulatory Visit
Admission: RE | Admit: 2021-12-20 | Discharge: 2021-12-20 | Disposition: A | Discharge status: Home / Self Care | Payer: Medicare PPO | Source: Ambulatory Visit | Attending: Urology | Admitting: Urology

## 2021-12-20 ENCOUNTER — Other Ambulatory Visit: Payer: Self-pay

## 2021-12-20 DIAGNOSIS — R972 Elevated prostate specific antigen [PSA]: Secondary | ICD-10-CM | POA: Insufficient documentation

## 2021-12-20 IMAGING — MR MR PROSTATE WO/W CM
56 series · 56 of 56 positions shown · IV contrast (9ml Gadavist)
Comparison: None.

CLINICAL DATA: Elevated PSA. PSA equal 5.8. Prior uro lift
procedure

EXAM:
MR PROSTATE WITHOUT AND WITH CONTRAST
TECHNIQUE: Multiplanar multisequence MRI images were obtained of the pelvis
centered about the prostate. Pre and post contrast images were
obtained.
CONTRAST:  9mL GADAVIST GADOBUTROL 1 MMOL/ML IV SOLN

[Series 2: ax in&out whole · axial · 6.0mm · 0.74mm/px · 1 of 35 slices shown (1 of 2)]
[im 1/35]
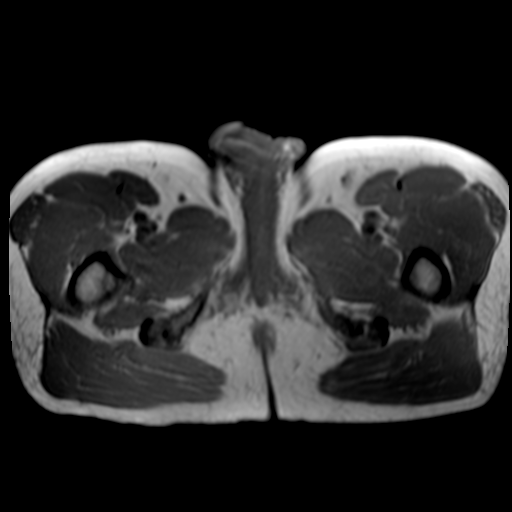

[Series 2: ax in&out whole · axial · 6.0mm · 0.74mm/px · 1 of 35 slices shown (2 of 2)]
[im 1/35]
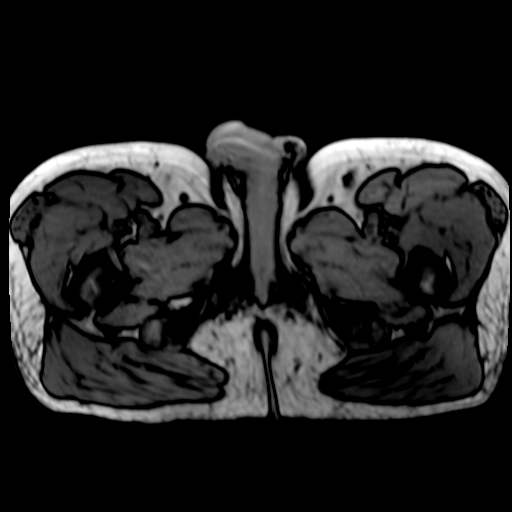

[Series 4: T2 · coronal · 3.0mm · 0.70mm/px · 1 of 30 slices shown (1 of 3)]
[im 1/30]
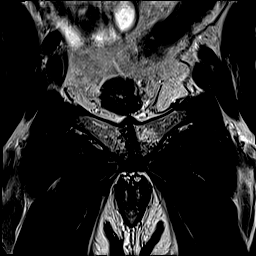

[Series 5: T2 · axial · 3.0mm · 0.56mm/px · 1 of 25 slices shown (2 of 3)]
[im 1/25]
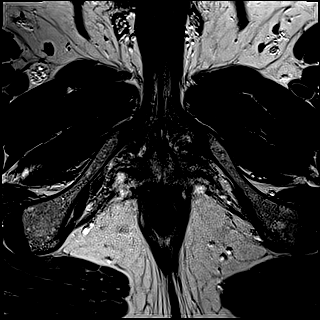

[Series 6: DWI · axial · 3.0mm · 0.86mm/px · 1 of 75 slices shown (1 of 3)]
[im 1/75]
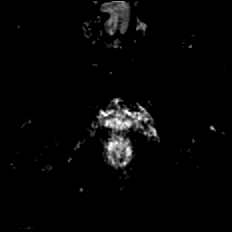

[Series 7: DWI · axial · 3.0mm · 0.86mm/px · 1 of 25 slices shown (2 of 3)]
[im 1/25]
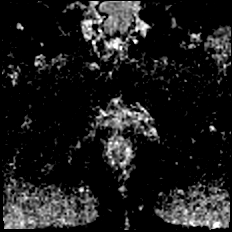

[Series 8: DWI · axial · 3.0mm · 0.86mm/px · 1 of 25 slices shown (3 of 3)]
[im 1/25]
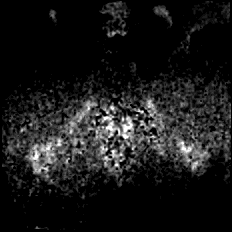

[Series 9: T2 · axial · 1.0mm · 1.04mm/px · 1 of 80 slices shown (3 of 3)]
[im 1/80]
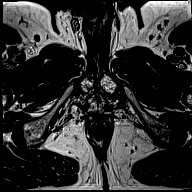

[Series 10: T1 · axial · 3.0mm · 1.15mm/px · 1 of 28 slices shown (1 of 48)]
[im 1/28]
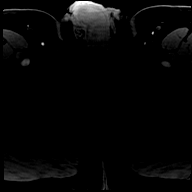

[Series 11: T1 · axial · 3.0mm · 1.15mm/px · 1 of 28 slices shown (2 of 48)]
[im 1/28]
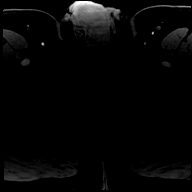

[Series 12: T1 · axial · 3.0mm · 1.15mm/px · 1 of 28 slices shown (3 of 48)]
[im 1/28]
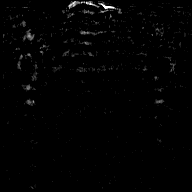

[Series 13: T1 · axial · 3.0mm · 1.15mm/px · 1 of 28 slices shown (4 of 48)]
[im 1/28]
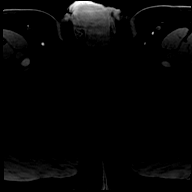

[Series 14: T1 · axial · 3.0mm · 1.15mm/px · 1 of 28 slices shown (5 of 48)]
[im 1/28]
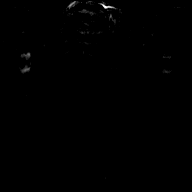

[Series 15: T1 · axial · 3.0mm · 1.15mm/px · 1 of 28 slices shown (6 of 48)]
[im 1/28]
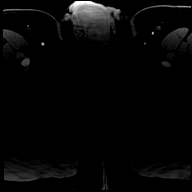

[Series 16: T1 · axial · 3.0mm · 1.15mm/px · 1 of 28 slices shown (7 of 48)]
[im 1/28]
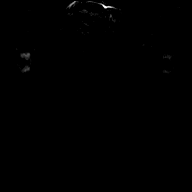

[Series 17: T1 · axial · 3.0mm · 1.15mm/px · 1 of 28 slices shown (8 of 48)]
[im 1/28]
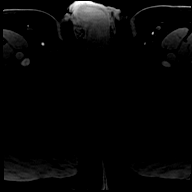

[Series 18: T1 · axial · 3.0mm · 1.15mm/px · 1 of 28 slices shown (9 of 48)]
[im 1/28]
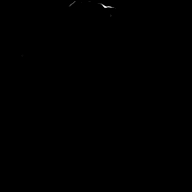

[Series 19: T1 · axial · 3.0mm · 1.15mm/px · 1 of 28 slices shown (10 of 48)]
[im 1/28]
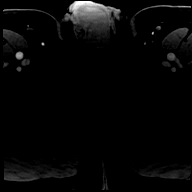

[Series 20: T1 · axial · 3.0mm · 1.15mm/px · 1 of 28 slices shown (11 of 48)]
[im 1/28]
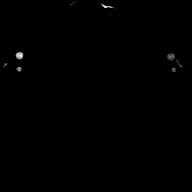

[Series 21: T1 · axial · 3.0mm · 1.15mm/px · 1 of 28 slices shown (12 of 48)]
[im 1/28]
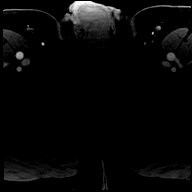

[Series 22: T1 · axial · 3.0mm · 1.15mm/px · 1 of 28 slices shown (13 of 48)]
[im 1/28]
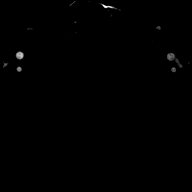

[Series 23: T1 · axial · 3.0mm · 1.15mm/px · 1 of 28 slices shown (14 of 48)]
[im 1/28]
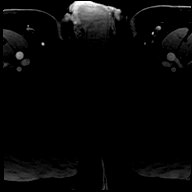

[Series 24: T1 · axial · 3.0mm · 1.15mm/px · 1 of 28 slices shown (15 of 48)]
[im 1/28]
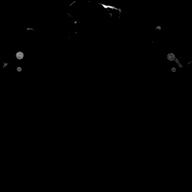

[Series 25: T1 · axial · 3.0mm · 1.15mm/px · 1 of 28 slices shown (16 of 48)]
[im 1/28]
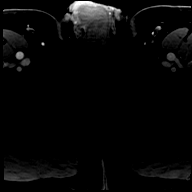

[Series 26: T1 · axial · 3.0mm · 1.15mm/px · 1 of 28 slices shown (17 of 48)]
[im 1/28]
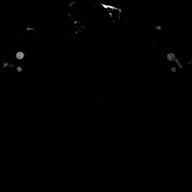

[Series 27: T1 · axial · 3.0mm · 1.15mm/px · 1 of 28 slices shown (18 of 48)]
[im 1/28]
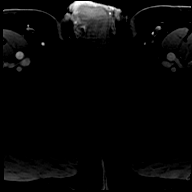

[Series 28: T1 · axial · 3.0mm · 1.15mm/px · 1 of 28 slices shown (19 of 48)]
[im 1/28]
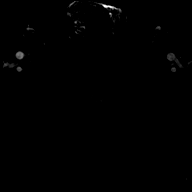

[Series 29: T1 · axial · 3.0mm · 1.15mm/px · 1 of 28 slices shown (20 of 48)]
[im 1/28]
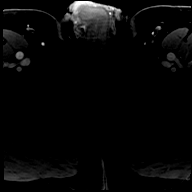

[Series 30: T1 · axial · 3.0mm · 1.15mm/px · 1 of 28 slices shown (21 of 48)]
[im 1/28]
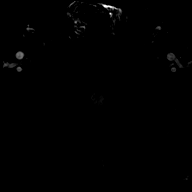

[Series 31: T1 · axial · 3.0mm · 1.15mm/px · 1 of 28 slices shown (22 of 48)]
[im 1/28]
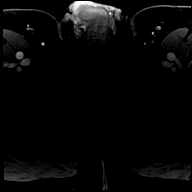

[Series 32: T1 · axial · 3.0mm · 1.15mm/px · 1 of 28 slices shown (23 of 48)]
[im 1/28]
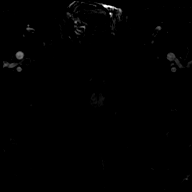

[Series 33: T1 · axial · 3.0mm · 1.15mm/px · 1 of 28 slices shown (24 of 48)]
[im 1/28]
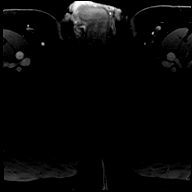

[Series 34: T1 · axial · 3.0mm · 1.15mm/px · 1 of 28 slices shown (25 of 48)]
[im 1/28]
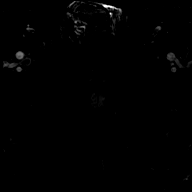

[Series 35: T1 · axial · 3.0mm · 1.15mm/px · 1 of 28 slices shown (26 of 48)]
[im 1/28]
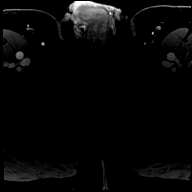

[Series 36: T1 · axial · 3.0mm · 1.15mm/px · 1 of 28 slices shown (27 of 48)]
[im 1/28]
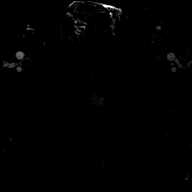

[Series 37: T1 · axial · 3.0mm · 1.15mm/px · 1 of 28 slices shown (28 of 48)]
[im 1/28]
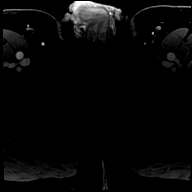

[Series 38: T1 · axial · 3.0mm · 1.15mm/px · 1 of 28 slices shown (29 of 48)]
[im 1/28]
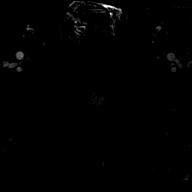

[Series 39: T1 · axial · 3.0mm · 1.15mm/px · 1 of 28 slices shown (30 of 48)]
[im 1/28]
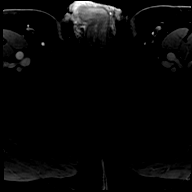

[Series 40: T1 · axial · 3.0mm · 1.15mm/px · 1 of 28 slices shown (31 of 48)]
[im 1/28]
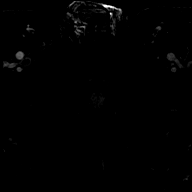

[Series 41: T1 · axial · 3.0mm · 1.15mm/px · 1 of 28 slices shown (32 of 48)]
[im 1/28]
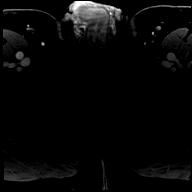

[Series 42: T1 · axial · 3.0mm · 1.15mm/px · 1 of 28 slices shown (33 of 48)]
[im 1/28]
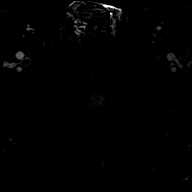

[Series 43: T1 · axial · 3.0mm · 1.15mm/px · 1 of 28 slices shown (34 of 48)]
[im 1/28]
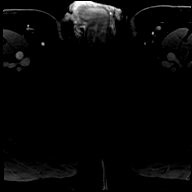

[Series 44: T1 · axial · 3.0mm · 1.15mm/px · 1 of 28 slices shown (35 of 48)]
[im 1/28]
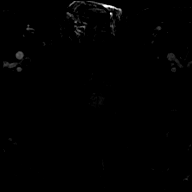

[Series 45: T1 · axial · 3.0mm · 1.15mm/px · 1 of 28 slices shown (36 of 48)]
[im 1/28]
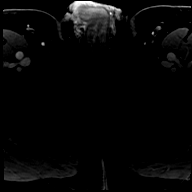

[Series 46: T1 · axial · 3.0mm · 1.15mm/px · 1 of 28 slices shown (37 of 48)]
[im 1/28]
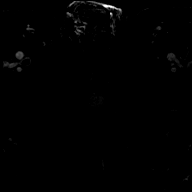

[Series 47: T1 · axial · 3.0mm · 1.15mm/px · 1 of 28 slices shown (38 of 48)]
[im 1/28]
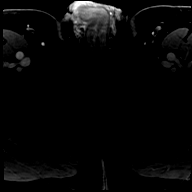

[Series 48: T1 · axial · 3.0mm · 1.15mm/px · 1 of 28 slices shown (39 of 48)]
[im 1/28]
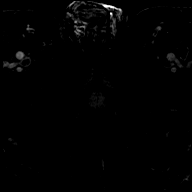

[Series 49: T1 · axial · 3.0mm · 1.15mm/px · 1 of 28 slices shown (40 of 48)]
[im 1/28]
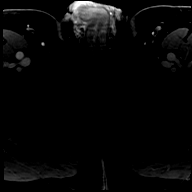

[Series 50: T1 · axial · 3.0mm · 1.15mm/px · 1 of 28 slices shown (41 of 48)]
[im 1/28]
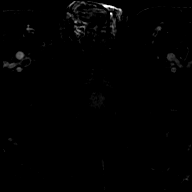

[Series 51: T1 · axial · 3.0mm · 1.15mm/px · 1 of 28 slices shown (42 of 48)]
[im 1/28]
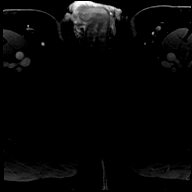

[Series 52: T1 · axial · 3.0mm · 1.15mm/px · 1 of 28 slices shown (43 of 48)]
[im 1/28]
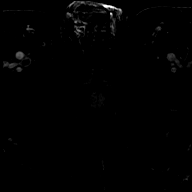

[Series 53: T1 · axial · 3.0mm · 1.15mm/px · 1 of 28 slices shown (44 of 48)]
[im 1/28]
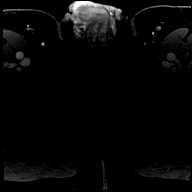

[Series 54: T1 · axial · 3.0mm · 1.15mm/px · 1 of 28 slices shown (45 of 48)]
[im 1/28]
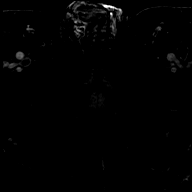

[Series 55: T1 · axial · 3.0mm · 1.15mm/px · 1 of 28 slices shown (46 of 48)]
[im 1/28]
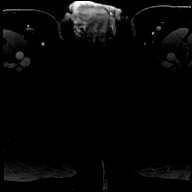

[Series 56: T1 · axial · 3.0mm · 1.15mm/px · 1 of 28 slices shown (47 of 48)]
[im 1/28]
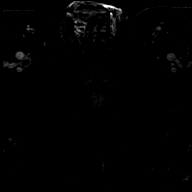

[Series 57: T1 · axial · 3.0mm · 1.15mm/px · 1 of 28 slices shown (48 of 48)]
[im 1/28]
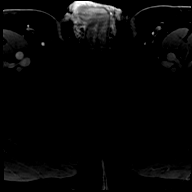

[56 of 56 positions shown; findings below may reference images not displayed]

FINDINGS: Prostate: There is heterogeneous low signal intensity within the
peripheral zone on T2 weighted imaging without focal lesion. There
is no discrete foci restricted diffusion. There is overall low
signal intensity on the early B series diffusion-weighted imaging.

One generalized region of low signal intensity on T2 weighted
imaging measures 16 x 12 mm (image [DATE]) within the is posterior
LEFT mid gland peripheral zone.

Artifact from the uro lift within the transitional zone. No discrete
lesion.

No focal abnormal enhancement pattern to localize prostate
carcinoma.

Volume: 6.0 x 4.3 by 4.7 cm (volume = 63 cm^3)

Transcapsular spread:  Absent

Seminal vesicle involvement: Absent

Neurovascular bundle involvement: Absent

Pelvic adenopathy: Absent

Bone metastasis: Absent

Other findings: Absent
IMPRESSION: 1. No clear evidence of high-grade carcinoma within the peripheral
zone. Heterogeneous signal within the peripheral zone could indicate
prior prostatitis.
2. Region of low signal intensity within the LEFT mid gland is
indeterminate and may represent prior inflammation or infection
versus adenocarcinoma. PI-RADS: 3. (Dynacad 3D post processing
performed). ABBATE #1.
3. No discrete abnormality in the transitional zone. Uro lift
artifact noted. PI-RADS: 2

## 2021-12-20 MED ORDER — GADOBUTROL 1 MMOL/ML IV SOLN
9.0000 mL | Freq: Once | INTRAVENOUS | Status: AC | PRN
  Administered 2021-12-20: 9 mL via INTRAVENOUS

## 2021-12-22 ENCOUNTER — Encounter: Payer: Self-pay | Admitting: Urology

## 2021-12-23 ENCOUNTER — Other Ambulatory Visit: Payer: Medicare PPO

## 2021-12-24 ENCOUNTER — Other Ambulatory Visit: Payer: Self-pay | Admitting: Urology

## 2021-12-24 DIAGNOSIS — R972 Elevated prostate specific antigen [PSA]: Secondary | ICD-10-CM

## 2021-12-24 DIAGNOSIS — R935 Abnormal findings on diagnostic imaging of other abdominal regions, including retroperitoneum: Secondary | ICD-10-CM

## 2022-01-08 ENCOUNTER — Other Ambulatory Visit: Payer: Self-pay | Admitting: *Deleted

## 2022-01-08 DIAGNOSIS — R972 Elevated prostate specific antigen [PSA]: Secondary | ICD-10-CM

## 2022-01-09 ENCOUNTER — Other Ambulatory Visit: Payer: Self-pay

## 2022-01-09 ENCOUNTER — Other Ambulatory Visit: Payer: Medicare PPO

## 2022-01-09 DIAGNOSIS — E349 Endocrine disorder, unspecified: Secondary | ICD-10-CM

## 2022-01-09 DIAGNOSIS — R972 Elevated prostate specific antigen [PSA]: Secondary | ICD-10-CM

## 2022-01-10 LAB — TESTOSTERONE FREE, PROFILE I
Albumin: 4.3 g/dL (ref 3.8–4.8)
Sex Hormone Binding: 35.9 nmol/L (ref 19.3–76.4)
Testost., Free, Calc: 44.6 pg/mL (ref 34.7–150.3)
Testosterone: 243 ng/dL — ABNORMAL LOW (ref 264–916)

## 2022-01-10 LAB — PSA: Prostate Specific Ag, Serum: 3.4 ng/mL (ref 0.0–4.0)

## 2022-01-13 ENCOUNTER — Encounter: Payer: Self-pay | Admitting: Urology

## 2022-01-13 ENCOUNTER — Ambulatory Visit
Admission: RE | Admit: 2022-01-13 | Discharge: 2022-01-13 | Disposition: A | Discharge status: Home / Self Care | Payer: Medicare PPO | Attending: Urology | Admitting: Urology

## 2022-01-13 ENCOUNTER — Ambulatory Visit: Payer: Medicare PPO | Admitting: Urology

## 2022-01-13 ENCOUNTER — Ambulatory Visit
Admission: RE | Admit: 2022-01-13 | Discharge: 2022-01-13 | Disposition: A | Discharge status: Home / Self Care | Payer: Medicare PPO | Source: Ambulatory Visit | Attending: Urology | Admitting: Urology

## 2022-01-13 ENCOUNTER — Other Ambulatory Visit: Payer: Self-pay

## 2022-01-13 ENCOUNTER — Other Ambulatory Visit: Payer: Self-pay | Admitting: Family Medicine

## 2022-01-13 VITALS — BP 130/82 | HR 74 | Ht 70.0 in | Wt 200.0 lb

## 2022-01-13 DIAGNOSIS — R972 Elevated prostate specific antigen [PSA]: Secondary | ICD-10-CM | POA: Diagnosis not present

## 2022-01-13 DIAGNOSIS — N200 Calculus of kidney: Secondary | ICD-10-CM | POA: Insufficient documentation

## 2022-01-13 DIAGNOSIS — N401 Enlarged prostate with lower urinary tract symptoms: Secondary | ICD-10-CM

## 2022-01-13 IMAGING — CR DG ABDOMEN 1V
1 series · 2 of 2 positions shown · non-contrast
Comparison: Previous studies including the CT done on [DATE]

CLINICAL DATA: Kidney stones

EXAM:
ABDOMEN - 1 VIEW

[Series 1: dg abd 1 view · 0.14mm/px · 2 of 2 slices shown]
[im 1/2]
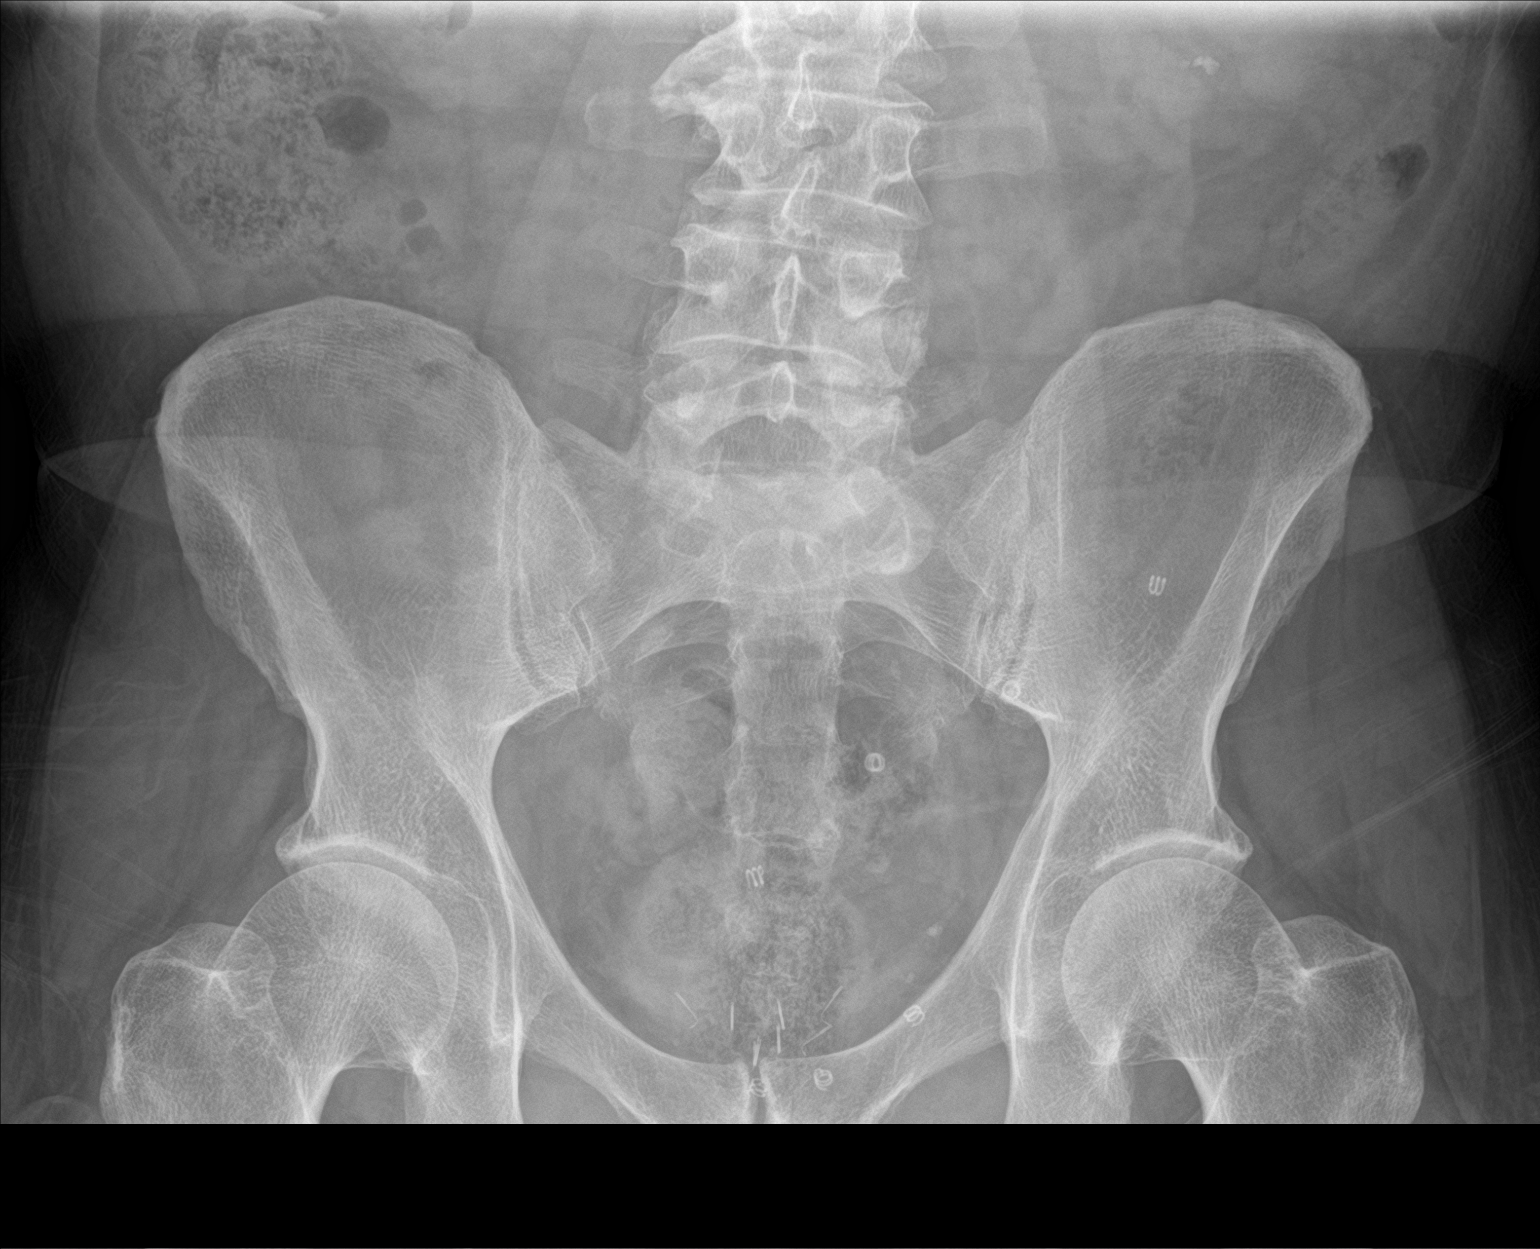
[im 2/2]
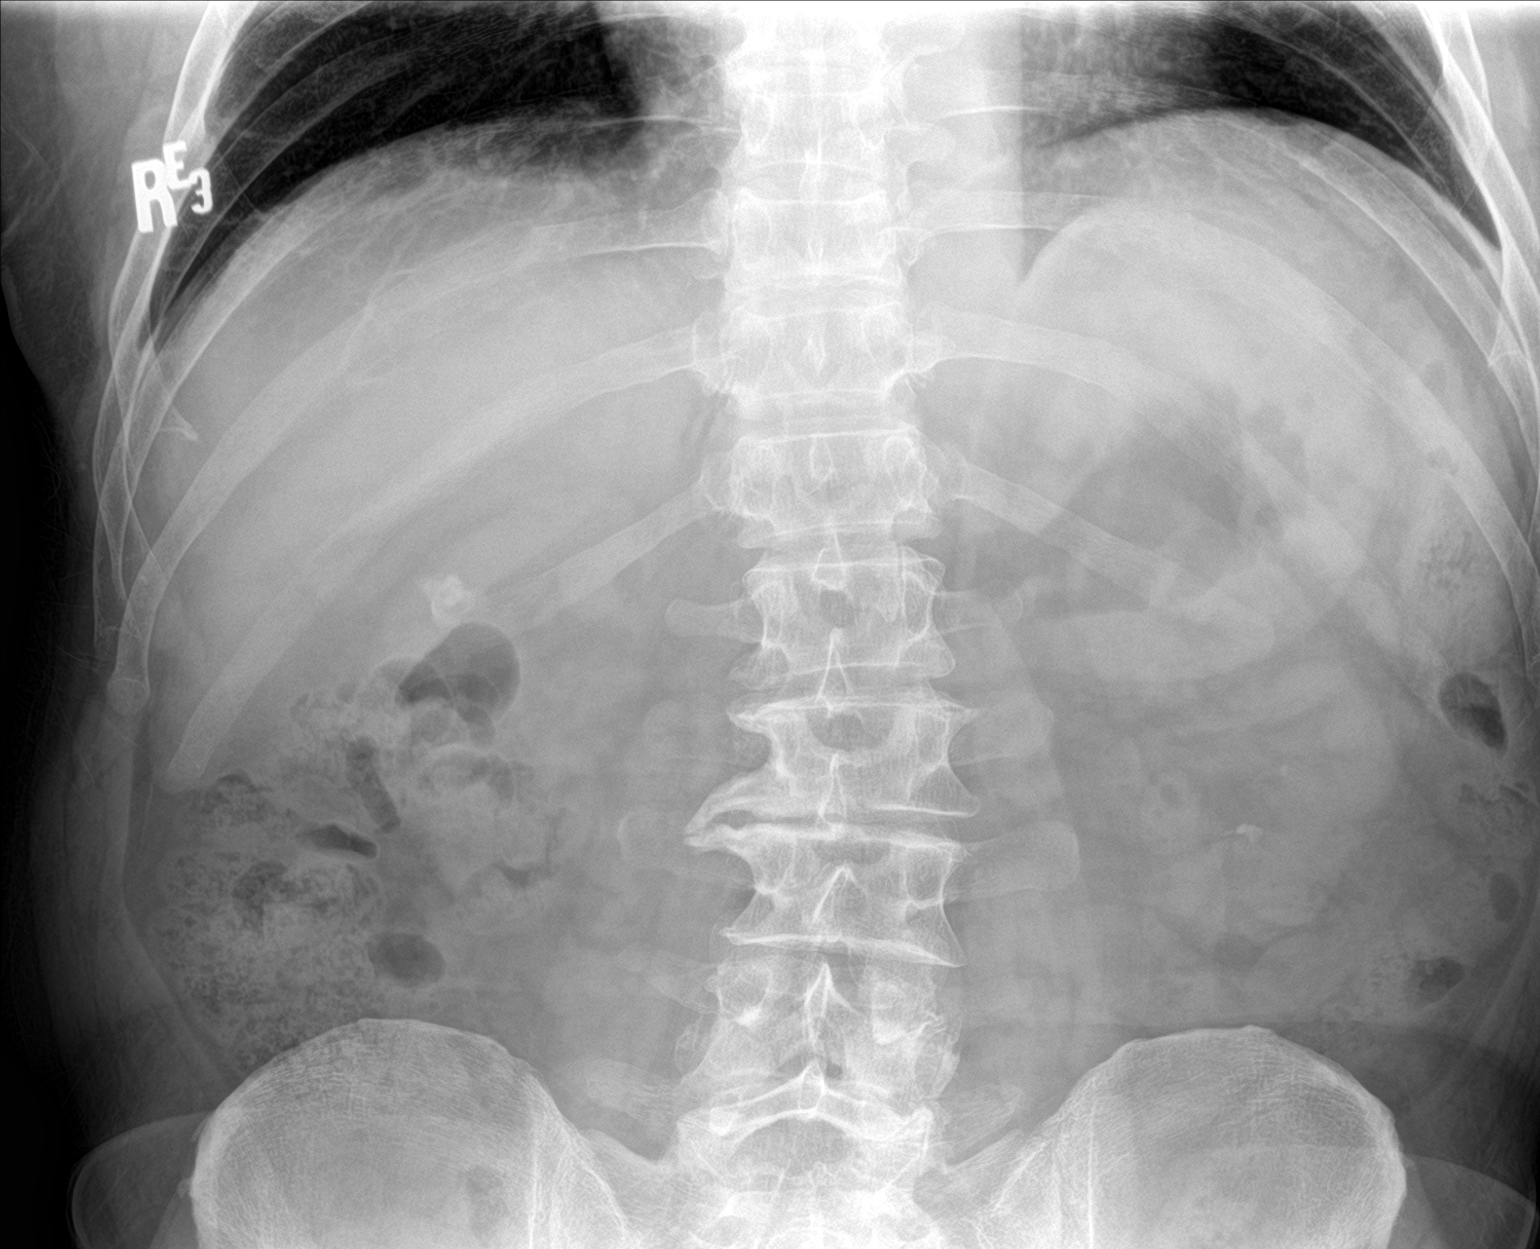

[2 of 2 positions shown; findings below may reference images not displayed]

FINDINGS: There are few small calcific densities in the left kidney largest
measuring 6 mm in size. There is 1.3 cm calcific density in the
right upper quadrant, possibly gallbladder stone. Kidneys are partly
obscured by bowel contents. Bowel gas pattern is nonspecific. Small
amount of stool is seen in colon. There are metallic densities in
the region of prostate, possibly suggesting previous intervention
such as brachytherapy. There is evidence of left inguinal hernia
repair. Degenerative changes are noted in the lumbar spine.
IMPRESSION: There are few small left renal stones. Possible calcified
gallbladder stone.

## 2022-01-13 MED ORDER — TADALAFIL 5 MG PO TABS: 5.0000 mg | ORAL_TABLET | Freq: Every day | ORAL | 3 refills | Status: DC | PRN

## 2022-01-13 NOTE — Progress Notes (Signed)
01/13/2022 1:53 PM   Lucas Christensen [birth year] [id number]  Referring provider: Jerrol Banana., MD [address] [place],  [place] [address]  Chief Complaint  Patient presents with   Nephrolithiasis    Urologic history:   1.  Nephrolithiasis -status post left ureteroscopic stone removal May 2014 -CT 09/2020 nonobstructing left lower pole calculus   2.  History elevated PSA -Baseline PSA upper 1/low 2 range; intermittent elevation as high as 6.1 -No previous biopsy   3.  BPH with lower urinary tract symptoms -Status post UroLift in [place] 2018 -On tadalafil 5 mg daily for mild to moderate LUTS -Unable to tolerate alpha-blocker secondary to orthostatic symptoms   4.  Episode acute prostatitis September 2019   HPI: 71 y.o. male presents for annual follow-up.  He called for a visit November 2022 for worsening lower urinary tract symptoms and also requested a PSA Urinalysis did show pyuria and PSA was not recommended at that time.  Urine culture was negative and PSA drawn 1 month later was 5.8 MRI showed a PI-RADS 3 lesion left mid gland and a 63 g gland He specifically scheduled a transperineal fusion biopsy and has an appointment scheduled at [facility] next month No flank, abdominal or pelvic pain PSA repeated with his annual blood work and was 3.4   PMH: Past Medical History:  Diagnosis Date   Arthritis    left shoulder   Barrett esophagus    BPH (benign prostatic hyperplasia)    Calculus of both kidneys    Chronic prostatitis    Elevated PSA    GERD (gastroesophageal reflux disease)    Hiatal hernia    Hyperlipidemia    Prostatitis    Squamous cell skin cancer    Testicular hypofunction    Wears glasses     Surgical History: Past Surgical History:  Procedure Laterality Date   COLONOSCOPY WITH PROPOFOL N/A 09/13/2018   Procedure: COLONOSCOPY WITH PROPOFOL;  Surgeon: Lucilla Lame, MD;  Location: [facility];  Service: Endoscopy;   Laterality: N/A;   ESOPHAGOGASTRODUODENOSCOPY (EGD) WITH PROPOFOL N/A 08/16/2020   Procedure: ESOPHAGOGASTRODUODENOSCOPY (EGD) WITH PROPOFOL;  Surgeon: Lucilla Lame, MD;  Location: [facility];  Service: Endoscopy;  Laterality: N/A;   ESOPHAGOGASTRODUODENOSCOPY (EGD) WITH PROPOFOL N/A 10/07/2021   Procedure: ESOPHAGOGASTRODUODENOSCOPY (EGD) WITH PROPOFOL;  Surgeon: Lucilla Lame, MD;  Location: [facility];  Service: Endoscopy;  Laterality: N/A;   HERNIA REPAIR Left 2005   left inguinal   PROSTATE SURGERY     SHOULDER SURGERY Left     Home Medications:  Allergies as of 01/13/2022       Reactions   Flomax [tamsulosin]    Dizzy        Medication List        Accurate as of January 13, 2022  1:53 PM. If you have any questions, ask your nurse or doctor.          cholecalciferol 1000 units tablet Commonly known as: VITAMIN D Take 1,000 Units by mouth daily.   multivitamin tablet Take 1 tablet by mouth daily.   niacin 500 MG CR capsule Take by mouth.   pantoprazole 40 MG tablet Commonly known as: Protonix Take 1 tablet (40 mg total) by mouth 2 (two) times daily.   rosuvastatin 20 MG tablet Commonly known as: CRESTOR TAKE 1 TABLET BY MOUTH EVERY DAY   tadalafil 5 MG tablet Commonly known as: CIALIS Take 1 tablet (5 mg total) by mouth daily as  needed for erectile dysfunction.   vitamin C 1000 MG tablet Take by mouth.   vitamin E 1000 UNIT capsule Take by mouth.        Allergies:  Allergies  Allergen Reactions   Flomax [Tamsulosin]     Dizzy     Family History: Family History  Problem Relation Age of Onset   Hypertension Mother    Heart attack Mother    Lung cancer Mother    Arthritis Father    Diabetes Father    Hypertension Sister    Multiple myeloma Sister    Diabetes Maternal Grandmother     Social History:  reports that he has never smoked. He has never used smokeless tobacco. He reports that he does not drink alcohol and does  not use drugs.   Physical Exam: BP 130/82    Pulse 74    Ht _0  (1.778 m)    Wt 200 lb (90.7 kg)    BMI 28.70 kg/m   Constitutional:  Alert and oriented, No acute distress. HEENT: [place] AT, moist mucus membranes.  Trachea midline, no masses. Cardiovascular: No clubbing, cyanosis, or edema. Respiratory: Normal respiratory effort, no increased work of breathing. Psychiatric: Normal mood and affect.   Pertinent Imaging: Images of a KUB performed earlier today were personally reviewed and interpreted.  Nonobstructing left lower pole calculus  Assessment & Plan:    1.  Left nephrolithiasis Stable Surveillance recommended  2.  Prostate MRI with PI-RADS 3 lesion Fusion biopsy [facility] pending  3.  BPH with LUTS Tadalafil refilled  Continue annual follow-up   Lucas Sons, MD  [facility] [address] [place], [place] [address] [phone]

## 2022-01-14 ENCOUNTER — Other Ambulatory Visit: Payer: Self-pay | Admitting: *Deleted

## 2022-01-14 ENCOUNTER — Telehealth: Payer: Self-pay | Admitting: Urology

## 2022-01-14 DIAGNOSIS — N401 Enlarged prostate with lower urinary tract symptoms: Secondary | ICD-10-CM

## 2022-01-14 DIAGNOSIS — R972 Elevated prostate specific antigen [PSA]: Secondary | ICD-10-CM

## 2022-01-14 DIAGNOSIS — E349 Endocrine disorder, unspecified: Secondary | ICD-10-CM

## 2022-01-14 MED ORDER — TADALAFIL 5 MG PO TABS: 5.0000 mg | ORAL_TABLET | Freq: Every day | ORAL | 3 refills | Status: DC | PRN

## 2022-01-14 NOTE — Telephone Encounter (Signed)
Medication sent to [facility]

## 2022-01-14 NOTE — Telephone Encounter (Signed)
Patient called and stated that his rx for Cialis was sent to the wrong pharmacy.  He would like for rx to be sent to [facility] in [place]

## 2022-01-15 ENCOUNTER — Encounter: Payer: Self-pay | Admitting: Urology

## 2022-01-17 ENCOUNTER — Telehealth: Payer: Self-pay | Admitting: Family Medicine

## 2022-01-17 NOTE — Telephone Encounter (Signed)
LMOM informing patient the insurance company has denied Tadalafil 5mg .

## 2022-01-18 ENCOUNTER — Encounter: Payer: Medicare PPO | Admitting: Nurse Practitioner

## 2022-01-19 ENCOUNTER — Other Ambulatory Visit: Payer: Self-pay

## 2022-01-19 ENCOUNTER — Ambulatory Visit
Admission: RE | Admit: 2022-01-19 | Discharge: 2022-01-19 | Disposition: A | Discharge status: Home / Self Care | Payer: Medicare PPO | Source: Ambulatory Visit | Attending: Emergency Medicine | Admitting: Emergency Medicine

## 2022-01-19 VITALS — BP 100/66 | HR 78 | Temp 98.8°F | Resp 16

## 2022-01-19 DIAGNOSIS — U071 COVID-19: Secondary | ICD-10-CM

## 2022-01-19 MED ORDER — MOLNUPIRAVIR EUA 200MG CAPSULE: 4.0000 | ORAL_CAPSULE | Freq: Two times a day (BID) | ORAL | 0 refills | Status: AC

## 2022-01-19 NOTE — ED Triage Notes (Signed)
Pt presents with pos covid home test. He is c/o fever and body aches. He would like to know what he can take for his symptoms.

## 2022-01-19 NOTE — Discharge Instructions (Addendum)
Take the molnupiravir as directed.  Take Tylenol as needed for fever or discomfort.  Rest and keep yourself hydrated.  Continue to quarantine per CDC guidelines.    Go to the emergency department if you have shortness of breath or other concerning symptoms.    Call your primary care provider to let them know that you are COVID positive and taking molnupiravir.

## 2022-01-19 NOTE — ED Provider Notes (Signed)
Lucas Christensen    CSN: [id number] Arrival date & time: 01/19/22  1103      History   Chief Complaint Chief Complaint  Patient presents with   Covid Positive    HPI Lucas Christensen is a 71 y.o. male.  Patient presents with fever and body aches last night.  He took Tylenol and his symptoms improved.  He tested positive for COVID last night at home.  He denies rash, ear pain, sore throat, cough, chest pain, shortness of breath, vomiting, diarrhea, or other symptoms.  His medical history includes hypertension and diabetes.  The history is provided by the patient and medical records.   Past Medical History:  Diagnosis Date   Arthritis    left shoulder   Barrett esophagus    BPH (benign prostatic hyperplasia)    Calculus of both kidneys    Chronic prostatitis    Elevated PSA    GERD (gastroesophageal reflux disease)    Hiatal hernia    Hyperlipidemia    Prostatitis    Squamous cell skin cancer    Testicular hypofunction    Wears glasses     Patient Active Problem List   Diagnosis Date Noted   Barrett's esophagus with dysplasia    Barrett's esophagus without dysplasia    Chronic prostatitis 10/10/2019   Encounter for screening colonoscopy    Hyperplasia, prostate 04/08/2016   Acid reflux 04/08/2016   Hypogonadism male 04/08/2016   Benign essential HTN 03/22/2015   Gastroesophageal reflux disease with hiatal hernia 03/22/2015   Combined fat and carbohydrate induced hyperlipemia 03/22/2015   Nonspecific chest pain 03/22/2015   Routine physical examination 05/03/2014   Increased frequency of urination 03/14/2014   Urinary tract infection, site not specified 03/14/2014   Dysphagia, unspecified(787.20) 08/29/2013   Dysphagia 08/29/2013   Laceration of hand with tendon involvement including fingers 05/27/2013   Gross hematuria 04/07/2013   Kidney stone 04/07/2013   Ureteric stone 04/07/2013   Elevated prostate specific antigen (PSA) 08/31/2012   Testicular  hypofunction 08/31/2012   Incomplete emptying of bladder 08/31/2012   Nodular prostate with urinary obstruction 08/31/2012   Hypotestosteronism 11/21/2009   Avitaminosis D 11/21/2009   Internal hemorrhoids without complication 88/89/1694   Hernia of abdominal cavity 12/14/2007   HLD (hyperlipidemia) 06/08/2007    Past Surgical History:  Procedure Laterality Date   COLONOSCOPY WITH PROPOFOL N/A 09/13/2018   Procedure: COLONOSCOPY WITH PROPOFOL;  Surgeon: Lucilla Lame, MD;  Location: [facility];  Service: Endoscopy;  Laterality: N/A;   ESOPHAGOGASTRODUODENOSCOPY (EGD) WITH PROPOFOL N/A 08/16/2020   Procedure: ESOPHAGOGASTRODUODENOSCOPY (EGD) WITH PROPOFOL;  Surgeon: Lucilla Lame, MD;  Location: [facility];  Service: Endoscopy;  Laterality: N/A;   ESOPHAGOGASTRODUODENOSCOPY (EGD) WITH PROPOFOL N/A 10/07/2021   Procedure: ESOPHAGOGASTRODUODENOSCOPY (EGD) WITH PROPOFOL;  Surgeon: Lucilla Lame, MD;  Location: [facility];  Service: Endoscopy;  Laterality: N/A;   HERNIA REPAIR Left 2005   left inguinal   PROSTATE SURGERY     SHOULDER SURGERY Left        Home Medications    Prior to Admission medications   Medication Sig Start Date End Date Taking? Authorizing Provider  molnupiravir EUA (LAGEVRIO) 200 mg CAPS capsule Take 4 capsules (800 mg total) by mouth 2 (two) times daily for 5 days. 01/19/22 01/24/22 Yes Sharion Balloon, NP  Ascorbic Acid (VITAMIN C) 1000 MG tablet Take by mouth.    [provider]  cholecalciferol (VITAMIN D) 1000 units tablet Take 1,000 Units by  mouth daily.    [provider]  Multiple Vitamin (MULTIVITAMIN) tablet Take 1 tablet by mouth daily.    [provider]  niacin 500 MG CR capsule Take by mouth. 06/08/07   [provider]  pantoprazole (PROTONIX) 40 MG tablet Take 1 tablet (40 mg total) by mouth 2 (two) times daily. 10/25/21 10/25/22  Lucilla Lame, MD  rosuvastatin (CRESTOR) 20 MG tablet TAKE 1 TABLET  BY MOUTH EVERY DAY 11/24/21   Jerrol Banana., MD  tadalafil (CIALIS) 5 MG tablet Take 1 tablet (5 mg total) by mouth daily as needed for erectile dysfunction. 01/14/22   Stoioff, Ronda Fairly, MD  vitamin E 1000 UNIT capsule Take by mouth.    [provider]    Family History Family History  Problem Relation Age of Onset   Hypertension Mother    Heart attack Mother    Lung cancer Mother    Arthritis Father    Diabetes Father    Hypertension Sister    Multiple myeloma Sister    Diabetes Maternal Grandmother     Social History Social History   Tobacco Use   Smoking status: Never   Smokeless tobacco: Never  Vaping Use   Vaping Use: Never used  Substance Use Topics   Alcohol use: No    Comment: occasional wine on Holidays   Drug use: No     Allergies   Flomax [tamsulosin]   Review of Systems Review of Systems  Constitutional:  Positive for fever. Negative for chills.  HENT:  Negative for ear pain and sore throat.   Respiratory:  Negative for cough and shortness of breath.   Cardiovascular:  Negative for chest pain and palpitations.  Gastrointestinal:  Negative for diarrhea and vomiting.  Skin:  Negative for color change and rash.  All other systems reviewed and are negative.   Physical Exam Triage Vital Signs ED Triage Vitals  Enc Vitals Group     BP      Pulse      Resp      Temp      Temp src      SpO2      Weight      Height      Head Circumference      Peak Flow      Pain Score      Pain Loc      Pain Edu?      Excl. in [place]?    No data found.  Updated Vital Signs BP (!) 92/51 (BP Location: Left Arm)    Pulse 78    Temp 98.8 F (37.1 C) (Oral)    Resp 16    SpO2 100%   Visual Acuity Right Eye Distance:   Left Eye Distance:   Bilateral Distance:    Right Eye Near:   Left Eye Near:    Bilateral Near:     Physical Exam Vitals and nursing note reviewed.  Constitutional:      General: He is not in acute distress.    Appearance:  Normal appearance. He is well-developed. He is not ill-appearing.  HENT:     Right Ear: Tympanic membrane normal.     Left Ear: Tympanic membrane normal.     Nose: Nose normal.     Mouth/Throat:     Mouth: Mucous membranes are moist.     Pharynx: Oropharynx is clear.  Cardiovascular:     Rate and Rhythm: Normal rate and regular rhythm.  Heart sounds: Normal heart sounds.  °Pulmonary:  °   Effort: Pulmonary effort is normal. No respiratory distress.  °   Breath sounds: Normal breath sounds.  °Abdominal:  °   Palpations: Abdomen is soft.  °   Tenderness: There is no abdominal tenderness.  °Musculoskeletal:  °   Cervical back: Neck supple.  °Skin: °   General: Skin is warm and dry.  °Neurological:  °   Mental Status: He is alert.  °Psychiatric:     °   Mood and Affect: Mood normal.     °   Behavior: Behavior normal.  ° ° ° °UC Treatments / Results  °Labs °(all labs ordered are listed, but only abnormal results are displayed) °Labs Reviewed - No data to display ° °EKG ° ° °Radiology °No results found. ° °Procedures °Procedures (including critical care time) ° °Medications Ordered in UC °Medications - No data to display ° °Initial Impression / Assessment and Plan / UC Course  °I have reviewed the triage vital signs and the nursing notes. ° °Pertinent labs & imaging results that were available during my care of the patient were reviewed by me and considered in my medical decision making (see chart for details). ° °  °COVID-19.  Discussed treatment options.  Treating with molnupiravir.  Discussed that this is an emergency authorized medication for treatment of COVID.  Discussed side effects including nausea, diarrhea, dizziness.  Discussed other symptomatic treatment including Tylenol, rest, hydration.  Instructed patient to follow-up with PCP if symptoms are not improving.  ED precautions discussed.  Patient agrees to plan of care.  ° °Final Clinical Impressions(s) / UC Diagnoses  ° °Final diagnoses:   °COVID-19  ° ° ° °Discharge Instructions   ° °  °Take the molnupiravir as directed.  Take Tylenol as needed for fever or discomfort.  Rest and keep yourself hydrated.  Continue to quarantine per CDC guidelines.   ° °Go to the emergency department if you have shortness of breath or other concerning symptoms.   ° °Call your primary care provider to let them know that you are COVID positive and taking molnupiravir.   °  ° ° ° ° °ED Prescriptions   ° ° Medication Sig Dispense Auth. Provider  ° molnupiravir EUA (LAGEVRIO) 200 mg CAPS capsule Take 4 capsules (800 mg total) by mouth 2 (two) times daily for 5 days. 40 capsule Tate, Kelly H, NP  ° °  ° °PDMP not reviewed this encounter. °  °Tate, Kelly H, NP °01/19/22 1132 ° °

## 2022-01-20 NOTE — Progress Notes (Signed)
NO response from patient

## 2022-02-20 ENCOUNTER — Other Ambulatory Visit: Payer: Self-pay | Admitting: Family Medicine

## 2022-05-25 ENCOUNTER — Other Ambulatory Visit: Payer: Self-pay | Admitting: Family Medicine

## 2022-05-25 DIAGNOSIS — E782 Mixed hyperlipidemia: Secondary | ICD-10-CM

## 2022-06-11 ENCOUNTER — Other Ambulatory Visit: Payer: Self-pay | Admitting: Family Medicine

## 2022-06-11 DIAGNOSIS — E782 Mixed hyperlipidemia: Secondary | ICD-10-CM

## 2022-06-22 ENCOUNTER — Other Ambulatory Visit: Payer: Self-pay | Admitting: Family Medicine

## 2022-06-22 DIAGNOSIS — E782 Mixed hyperlipidemia: Secondary | ICD-10-CM

## 2022-06-26 ENCOUNTER — Encounter: Payer: Self-pay | Admitting: Physician Assistant

## 2022-06-26 ENCOUNTER — Ambulatory Visit: Payer: Medicare PPO | Admitting: Physician Assistant

## 2022-06-26 DIAGNOSIS — E782 Mixed hyperlipidemia: Secondary | ICD-10-CM

## 2022-06-26 MED ORDER — ROSUVASTATIN CALCIUM 20 MG PO TABS: 20.0000 mg | ORAL_TABLET | Freq: Every day | ORAL | 3 refills | Status: DC

## 2022-06-26 NOTE — Assessment & Plan Note (Signed)
Managed with crestor 20 mg previously well controlled  Fasting lipids ordered

## 2022-06-26 NOTE — Progress Notes (Signed)
I,Sha'taria Tyson,acting as a Education administrator for [employer], PA-C.,have documented all relevant documentation on the behalf of Mikey Kirschner, PA-C,as directed by  Mikey Kirschner, PA-C while in the presence of Mikey Kirschner, PA-C.   Established patient visit   Patient: Lucas Christensen   DOB: 1951/11/29   71 y.o. Male  MRN: [id number] Visit Date: 06/26/2022  Today's healthcare provider: Mikey Kirschner, PA-C   Cc. HLD f/u  Subjective    HPI  Lipid/Cholesterol, Follow-up  Last lipid panel Other pertinent labs  Lab Results  Component Value Date   CHOL 147 01/31/2021   HDL 63 01/31/2021   LDLCALC 71 01/31/2021   TRIG 67 01/31/2021   CHOLHDL 2.3 01/31/2021   Lab Results  Component Value Date   ALT 34 01/31/2021   AST 41 (H) 01/31/2021   PLT 215 02/07/2019   TSH 2.170 02/07/2019     He was last seen for this 17 months ago.  Management since that visit includes continue statin.  He reports excellent compliance with treatment. He is not having side effects.   Symptoms: No chest pain No chest pressure/discomfort  No dyspnea No lower extremity edema  No numbness or tingling of extremity No orthopnea  No palpitations No paroxysmal nocturnal dyspnea  No speech difficulty No syncope   Current diet: in general, a "healthy" diet   Current exercise: running/ jogging, walking, and weightlifting  The 10-year ASCVD risk score (Arnett DK, et al., 2019) is: 12.4%  ---------------------------------------------------------------------------------------------------   Medications: Outpatient Medications Prior to Visit  Medication Sig   Ascorbic Acid (VITAMIN C) 1000 MG tablet Take by mouth.   cholecalciferol (VITAMIN D) 1000 units tablet Take 1,000 Units by mouth daily.   Multiple Vitamin (MULTIVITAMIN) tablet Take 1 tablet by mouth daily.   niacin 500 MG CR capsule Take by mouth.   pantoprazole (PROTONIX) 40 MG tablet Take 1 tablet (40 mg total) by mouth 2 (two) times daily.    tadalafil (CIALIS) 5 MG tablet Take 1 tablet (5 mg total) by mouth daily as needed for erectile dysfunction.   vitamin E 1000 UNIT capsule Take by mouth.   [DISCONTINUED] rosuvastatin (CRESTOR) 20 MG tablet TAKE 1 TABLET BY MOUTH EVERY DAY   No facility-administered medications prior to visit.    Review of Systems  Constitutional:  Negative for fatigue and fever.  Respiratory:  Negative for cough and shortness of breath.   Cardiovascular:  Negative for chest pain, palpitations and leg swelling.  Neurological:  Negative for dizziness and headaches.       Objective    Blood pressure 125/63, pulse 61, height '5\' 10"'$  (1.778 m), weight 218 lb 8 oz (99.1 kg), SpO2 96 %.   Physical Exam Constitutional:      General: He is awake.     Appearance: He is well-developed.  HENT:     Head: Normocephalic.  Eyes:     Conjunctiva/sclera: Conjunctivae normal.  Cardiovascular:     Rate and Rhythm: Normal rate and regular rhythm.     Heart sounds: Normal heart sounds.  Pulmonary:     Effort: Pulmonary effort is normal.     Breath sounds: Normal breath sounds.  Musculoskeletal:     Right lower leg: No edema.     Left lower leg: No edema.  Skin:    General: Skin is warm.  Neurological:     Mental Status: He is alert and oriented to person, place, and time.  Psychiatric:  Attention and Perception: Attention normal.        Mood and Affect: Mood normal.        Speech: Speech normal.        Behavior: Behavior is cooperative.      No results found for any visits on 06/26/22.  Assessment & Plan     Problem List Items Addressed This Visit       Other   HLD (hyperlipidemia)    Managed with crestor 20 mg previously well controlled  Fasting lipids ordered       Relevant Medications   rosuvastatin (CRESTOR) 20 MG tablet   Other Relevant Orders   Lipid Profile   Comprehensive Metabolic Panel (CMET)     Return in about 6 months (around 12/27/2022) for AVW.      I, Mikey Kirschner, PA-C have reviewed all documentation for this visit. The documentation on  06/26/2022 for the exam, diagnosis, procedures, and orders are all accurate and complete.  Mikey Kirschner, PA-C [facility] [address] [place], [place], [address] Office: [phone] Fax: [place]

## 2022-07-08 LAB — COMPREHENSIVE METABOLIC PANEL
ALT: 27 IU/L (ref 0–44)
AST: 33 IU/L (ref 0–40)
Albumin/Globulin Ratio: 1.7 (ref 1.2–2.2)
Albumin: 4.2 g/dL (ref 3.9–4.9)
Alkaline Phosphatase: 79 IU/L (ref 44–121)
BUN/Creatinine Ratio: 16 (ref 10–24)
BUN: 17 mg/dL (ref 8–27)
Bilirubin Total: 0.5 mg/dL (ref 0.0–1.2)
CO2: 25 mmol/L (ref 20–29)
Calcium: 9.1 mg/dL (ref 8.6–10.2)
Chloride: 104 mmol/L (ref 96–106)
Creatinine, Ser: 1.07 mg/dL (ref 0.76–1.27)
Globulin, Total: 2.5 g/dL (ref 1.5–4.5)
Glucose: 89 mg/dL (ref 70–99)
Potassium: 4.7 mmol/L (ref 3.5–5.2)
Sodium: 144 mmol/L (ref 134–144)
Total Protein: 6.7 g/dL (ref 6.0–8.5)
eGFR: 75 mL/min/{1.73_m2} (ref >59)

## 2022-07-08 LAB — LIPID PANEL
Chol/HDL Ratio: 2.7 ratio (ref 0.0–5.0)
Cholesterol, Total: 163 mg/dL (ref 100–199)
HDL: 60 mg/dL (ref >39)
LDL Chol Calc (NIH): 86 mg/dL (ref 0–99)
Triglycerides: 93 mg/dL (ref 0–149)
VLDL Cholesterol Cal: 17 mg/dL (ref 5–40)

## 2022-12-26 NOTE — Progress Notes (Signed)
I,Joseline E Rosas,acting as a scribe for [employer], MD.,have documented all relevant documentation on the behalf of Lucas Foster, MD,as directed by  Lucas Foster, MD while in the presence of Lucas Foster, MD.   Annual Wellness Visit     Patient: Lucas Christensen, Male    DOB: 1951-03-17, 72 y.o.   MRN: [id number] Visit Date: 12/29/2022  Today's Provider: [name], MD   No chief complaint on file.  Subjective    Lucas Christensen is a 72 y.o. male who presents today for his Annual Wellness Visit.  He reports consuming a  low carb  diet. Home exercise routine includes walks at least 5 miles daily. He generally feels well. He reports sleeping well. He does not have additional problems to discuss today.    He reports he has cut carbs and was able to continue with weight loss  Used to lift weight competitively but stopped after a shoulder injury   With weight loss has no more GERD symptoms    Chronic Prostatitis  Hypogonadism  Patient requests to have both testosterone and PSA levels checked  Reports that he follows with Urology, Dr. Edd Arbour  He would like to have the results available to his urologist   HPI  Health Maintenance  Influenza Vaccine: Today Covid Vaccine:Declined  Shingles MOQ:[id number]    Medications: Outpatient Medications Prior to Visit  Medication Sig Note   Ascorbic Acid (VITAMIN C) 1000 MG tablet Take by mouth.    cholecalciferol (VITAMIN D) 1000 units tablet Take 1,000 Units by mouth daily.    Multiple Vitamin (MULTIVITAMIN) tablet Take 1 tablet by mouth daily.    niacin 500 MG CR capsule Take by mouth.    rosuvastatin (CRESTOR) 20 MG tablet Take 1 tablet (20 mg total) by mouth daily. 12/29/2022: TAKES 1/2 TABLET    tadalafil (CIALIS) 5 MG tablet Take 1 tablet (5 mg total) by mouth daily as needed for erectile dysfunction.    vitamin E 1000 UNIT capsule Take by mouth.    [DISCONTINUED]  pantoprazole (PROTONIX) 40 MG tablet Take 1 tablet (40 mg total) by mouth 2 (two) times daily.    No facility-administered medications prior to visit.    Allergies  Allergen Reactions   Flomax [Tamsulosin]     Dizzy     Patient Care Team: Jerrol Banana., MD as PCP - General (Family Medicine)  Review of Systems  Constitutional:  Negative for appetite change and fatigue.  Respiratory:  Negative for chest tightness and shortness of breath.   Cardiovascular:  Negative for chest pain, palpitations and leg swelling.        Objective    Vitals: BP 116/76 (BP Location: Left Arm, Patient Position: Sitting, Cuff Size: Normal)   Pulse (!) 56   Resp 16   Ht '5\' 10"'$  (1.778 m)   Wt 179 lb (81.2 kg)   BMI 25.68 kg/m     Physical Exam Vitals reviewed.  Constitutional:      General: He is not in acute distress.    Appearance: Normal appearance. He is not ill-appearing, toxic-appearing or diaphoretic.  HENT:     Head: Normocephalic and atraumatic.     Right Ear: Tympanic membrane and external ear normal.     Left Ear: Tympanic membrane and external ear normal.     Nose: Nose normal.     Mouth/Throat:     Mouth: Mucous membranes are moist.     Pharynx: No oropharyngeal exudate or posterior  oropharyngeal erythema.  Eyes:     General: No scleral icterus.    Extraocular Movements: Extraocular movements intact.     Conjunctiva/sclera: Conjunctivae normal.     Pupils: Pupils are equal, round, and reactive to light.  Neck:     Vascular: No carotid bruit.  Cardiovascular:     Rate and Rhythm: Normal rate and regular rhythm.     Pulses: Normal pulses.     Heart sounds: Normal heart sounds. No murmur heard.    No friction rub. No gallop.  Pulmonary:     Effort: Pulmonary effort is normal. No respiratory distress.     Breath sounds: Normal breath sounds. No stridor. No wheezing, rhonchi or rales.  Abdominal:     General: Bowel sounds are normal. There is no distension.      Palpations: Abdomen is soft.     Tenderness: There is no abdominal tenderness.  Musculoskeletal:        General: No swelling, tenderness or signs of injury. Normal range of motion.     Cervical back: Normal range of motion and neck supple. No rigidity or tenderness.     Right lower leg: No edema.     Left lower leg: No edema.  Lymphadenopathy:     Cervical: No cervical adenopathy.  Skin:    General: Skin is warm and dry.     Capillary Refill: Capillary refill takes less than 2 seconds.     Findings: No erythema or rash.  Neurological:     Mental Status: He is alert and oriented to person, place, and time.     Motor: No weakness.     Gait: Gait normal.  Psychiatric:        Attention and Perception: Attention normal.        Mood and Affect: Mood normal.        Speech: Speech normal.        Behavior: Behavior normal. Behavior is cooperative.        Thought Content: Thought content normal.      Most recent functional status assessment:    12/29/2022    9:31 AM  In your present state of health, do you have any difficulty performing the following activities:  Hearing? 1  Vision? 0  Difficulty concentrating or making decisions? 0  Walking or climbing stairs? 0  Dressing or bathing? 0  Doing errands, shopping? 0   Most recent fall risk assessment:    12/29/2022    9:31 AM  [place] in the past year? 0  Number falls in past yr: 0  Injury with Fall? 0  Risk for fall due to : No Fall Risks    Most recent depression screenings:    12/29/2022    9:31 AM 06/26/2022    2:01 PM  PHQ 2/9 Scores  PHQ - 2 Score 0 0  PHQ- 9 Score  0   Most recent cognitive screening:    10/05/2019   10:22 AM  6CIT Screen  What Year? 0 points  What month? 0 points  What time? 0 points  Count back from 20 0 points  Months in reverse 0 points  Repeat phrase 0 points  Total Score 0 points   Most recent Audit-C alcohol use screening    12/29/2022    9:32 AM  Alcohol Use Disorder  Test (AUDIT)  1. How often do you have a drink containing alcohol? 1  2. How many drinks containing alcohol do you have  on a typical day when you are drinking? 0  3. How often do you have six or more drinks on one occasion? 0  AUDIT-C Score 1   A score of 3 or more in women, and 4 or more in men indicates increased risk for alcohol abuse, EXCEPT if all of the points are from question 1   No results found for any visits on 12/29/22.  Assessment & Plan     Annual wellness visit done today including the all of the following: Reviewed patient's Family Medical History Reviewed and updated list of patient's medical providers Assessment of cognitive impairment was done Assessed patient's functional ability Established a written schedule for health screening services Health Risk Assessent Completed and Reviewed  Exercise Activities and Dietary recommendations  Goals   None     Immunization History  Administered Date(s) Administered   Fluad Quad(high Dose 65+) 12/29/2022   Influenza, High Dose Seasonal PF 08/18/2018   Influenza-Unspecified 09/23/2021   Pneumococcal Conjugate-13 08/18/2018   Pneumococcal Polysaccharide-23 10/05/2019   Tdap 05/14/2013   Zoster, Live 09/07/2013    Health Maintenance  Topic Date Due   COVID-19 Vaccine (1) Never done   Zoster Vaccines- Shingrix (1 of 2) Never done   DTaP/Tdap/Td (2 - Td or Tdap) 05/15/2023   Medicare Annual Wellness (AWV)  12/30/2023   COLONOSCOPY (Pts 45-55yr Insurance coverage will need to be confirmed)  10/07/2024   Pneumonia Vaccine 72 Years old  Completed   INFLUENZA VACCINE  Completed   Hepatitis C Screening  Completed   HPV VACCINES  Aged Out     Discussed health benefits of physical activity, and encouraged him to engage in regular exercise appropriate for his age and condition.    Problem List Items Addressed This Visit       Cardiovascular and Mediastinum   Benign essential HTN    Controlled BP at goal No  medications changes today  CMP collected today        Relevant Orders   Comprehensive metabolic panel     Endocrine   Hypogonadism male    Patient requested to have testosterone levels checked today  Testosterone ordered        Relevant Orders   PSA, total and free   Testosterone     Genitourinary   Nodular prostate with urinary obstruction    Patient follows with Urology, Dr. TEdd Arbour Would like to have testosterone and PSA levels checked today Both levels ordered         Other   HLD (hyperlipidemia)    Lipid panel ordered  Prescribed rosuvastatin '20mg'$  daily  Encouraged continued diet management  Reviewed previous lipid panel in goal range       Relevant Orders   Lipid panel   Avitaminosis D - Primary    Patient requested levels  Vitamin d ordered today  Managed with diet  Continue lifestyle management       Relevant Orders   Vitamin D (25 hydroxy)   Need for influenza vaccination    Influenza vaccine was administered today  Patient tolerated well        Relevant Orders   Flu Vaccine QUAD High Dose(Fluad) (Completed)     No follow-ups on file.       The entirety of the information documented in the History of Present Illness, Review of Systems and Physical Exam were personally obtained by me. Portions of this information were initially documented by JLyndel Pleasure CMA and reviewed by me for  thoroughness and accuracy.Lucas Foster, MD     Lucas Foster, MD  [facility] [phone] (phone) [phone] (fax)  [facility]

## 2022-12-29 ENCOUNTER — Ambulatory Visit: Payer: Medicare PPO | Admitting: Family Medicine

## 2022-12-29 ENCOUNTER — Encounter: Payer: Self-pay | Admitting: Family Medicine

## 2022-12-29 ENCOUNTER — Ambulatory Visit: Payer: Medicare PPO

## 2022-12-29 VITALS — BP 116/76 | HR 56 | Resp 16 | Ht 70.0 in | Wt 179.0 lb

## 2022-12-29 DIAGNOSIS — Z0000 Encounter for general adult medical examination without abnormal findings: Secondary | ICD-10-CM | POA: Diagnosis not present

## 2022-12-29 DIAGNOSIS — N138 Other obstructive and reflux uropathy: Secondary | ICD-10-CM

## 2022-12-29 DIAGNOSIS — E291 Testicular hypofunction: Secondary | ICD-10-CM

## 2022-12-29 DIAGNOSIS — I10 Essential (primary) hypertension: Secondary | ICD-10-CM

## 2022-12-29 DIAGNOSIS — E782 Mixed hyperlipidemia: Secondary | ICD-10-CM

## 2022-12-29 DIAGNOSIS — Z23 Encounter for immunization: Secondary | ICD-10-CM

## 2022-12-29 DIAGNOSIS — E559 Vitamin D deficiency, unspecified: Secondary | ICD-10-CM

## 2022-12-29 NOTE — Assessment & Plan Note (Signed)
Patient follows with Urology, Dr. Edd Arbour  Would like to have testosterone and PSA levels checked today Both levels ordered

## 2022-12-29 NOTE — Assessment & Plan Note (Signed)
Patient requested to have testosterone levels checked today  Testosterone ordered

## 2022-12-29 NOTE — Assessment & Plan Note (Signed)
Lipid panel ordered  Prescribed rosuvastatin '20mg'$  daily  Encouraged continued diet management  Reviewed previous lipid panel in goal range

## 2022-12-29 NOTE — Assessment & Plan Note (Signed)
Controlled BP at goal No medications changes today  CMP collected today

## 2022-12-29 NOTE — Assessment & Plan Note (Signed)
Influenza vaccine was administered today  Patient tolerated well   

## 2022-12-29 NOTE — Assessment & Plan Note (Signed)
Patient requested levels  Vitamin d ordered today  Managed with diet  Continue lifestyle management

## 2022-12-30 LAB — LIPID PANEL
Chol/HDL Ratio: 2.6 ratio (ref 0.0–5.0)
Cholesterol, Total: 175 mg/dL (ref 100–199)
HDL: 68 mg/dL (ref >39)
LDL Chol Calc (NIH): 92 mg/dL (ref 0–99)
Triglycerides: 78 mg/dL (ref 0–149)
VLDL Cholesterol Cal: 15 mg/dL (ref 5–40)

## 2022-12-30 LAB — COMPREHENSIVE METABOLIC PANEL
ALT: 50 IU/L — ABNORMAL HIGH (ref 0–44)
AST: 42 IU/L — ABNORMAL HIGH (ref 0–40)
Albumin/Globulin Ratio: 1.6 (ref 1.2–2.2)
Albumin: 4.3 g/dL (ref 3.9–4.9)
Alkaline Phosphatase: 135 IU/L — ABNORMAL HIGH (ref 44–121)
BUN/Creatinine Ratio: 16 (ref 10–24)
BUN: 16 mg/dL (ref 8–27)
Bilirubin Total: 0.7 mg/dL (ref 0.0–1.2)
CO2: 23 mmol/L (ref 20–29)
Calcium: 9.5 mg/dL (ref 8.6–10.2)
Chloride: 102 mmol/L (ref 96–106)
Creatinine, Ser: 1 mg/dL (ref 0.76–1.27)
Globulin, Total: 2.7 g/dL (ref 1.5–4.5)
Glucose: 86 mg/dL (ref 70–99)
Potassium: 4.3 mmol/L (ref 3.5–5.2)
Sodium: 139 mmol/L (ref 134–144)
Total Protein: 7 g/dL (ref 6.0–8.5)
eGFR: 81 mL/min/{1.73_m2} (ref >59)

## 2022-12-30 LAB — PSA, TOTAL AND FREE
PSA, Free Pct: 26.7 %
PSA, Free: 0.8 ng/mL
Prostate Specific Ag, Serum: 3 ng/mL (ref 0.0–4.0)

## 2022-12-30 LAB — TESTOSTERONE: Testosterone: 314 ng/dL (ref 264–916)

## 2022-12-30 LAB — VITAMIN D 25 HYDROXY (VIT D DEFICIENCY, FRACTURES): Vit D, 25-Hydroxy: 45 ng/mL (ref 30.0–100.0)

## 2023-01-01 ENCOUNTER — Other Ambulatory Visit: Payer: Self-pay | Admitting: Family Medicine

## 2023-01-01 DIAGNOSIS — R748 Abnormal levels of other serum enzymes: Secondary | ICD-10-CM

## 2023-01-12 ENCOUNTER — Other Ambulatory Visit: Payer: Self-pay | Admitting: *Deleted

## 2023-01-12 DIAGNOSIS — N200 Calculus of kidney: Secondary | ICD-10-CM

## 2023-01-14 ENCOUNTER — Encounter: Payer: Self-pay | Admitting: Urology

## 2023-01-14 ENCOUNTER — Ambulatory Visit
Admission: RE | Admit: 2023-01-14 | Discharge: 2023-01-14 | Disposition: A | Discharge status: Home / Self Care | Payer: Medicare PPO | Source: Ambulatory Visit | Attending: Urology | Admitting: Urology

## 2023-01-14 ENCOUNTER — Ambulatory Visit: Payer: Medicare PPO | Admitting: Urology

## 2023-01-14 ENCOUNTER — Ambulatory Visit
Admission: RE | Admit: 2023-01-14 | Discharge: 2023-01-14 | Disposition: A | Discharge status: Home / Self Care | Payer: Medicare PPO | Attending: Urology | Admitting: Urology

## 2023-01-14 VITALS — BP 116/73 | HR 58 | Ht 70.0 in | Wt 175.0 lb

## 2023-01-14 DIAGNOSIS — N200 Calculus of kidney: Secondary | ICD-10-CM | POA: Insufficient documentation

## 2023-01-14 DIAGNOSIS — N401 Enlarged prostate with lower urinary tract symptoms: Secondary | ICD-10-CM

## 2023-01-14 DIAGNOSIS — R972 Elevated prostate specific antigen [PSA]: Secondary | ICD-10-CM

## 2023-01-14 DIAGNOSIS — Z87898 Personal history of other specified conditions: Secondary | ICD-10-CM

## 2023-01-14 NOTE — Progress Notes (Signed)
01/14/2023 1:13 PM   Lucas Christensen Jan 26, 1951 [id number]  Referring provider: Eulas Post, MD [address] [place],  [place] [address]  Chief Complaint  Patient presents with   Nephrolithiasis    Urologic history:   1.  Nephrolithiasis -status post left ureteroscopic stone removal May 2014 -CT 09/2020 nonobstructing left lower pole calculi   2.  History elevated PSA -Baseline PSA upper 1/low 2 range; intermittent elevation as high as 6.1 -No previous biopsy   3.  BPH with lower urinary tract symptoms -Status post UroLift in [place] 2018 -On tadalafil 5 mg daily for mild to moderate LUTS -Unable to tolerate alpha-blocker secondary to orthostatic symptoms   4.  Episode acute prostatitis September 2019   HPI: 72 y.o. male presents for annual follow-up.  Doing well since last visit No bothersome LUTS Denies dysuria, gross hematuria Denies flank, abdominal or pelvic pain He was tentatively scheduled for a fusion biopsy at [facility] for his PSA of 5.8 and PI-RADS 3 lesion seen on MRI however on over read by [facility] radiology they did not feel the area was a PI-RADS 3 lesion and he had no suspicious lesions and it was recommended following the PSA.  A repeat PSA February 2023 was 3.4 PSA January 2024 by PCP was 3.0   PMH: Past Medical History:  Diagnosis Date   Arthritis    left shoulder   Barrett esophagus    BPH (benign prostatic hyperplasia)    Calculus of both kidneys    Chronic prostatitis    Elevated PSA    GERD (gastroesophageal reflux disease)    Hiatal hernia    Hyperlipidemia    Prostatitis    Squamous cell skin cancer    Testicular hypofunction    Wears glasses     Surgical History: Past Surgical History:  Procedure Laterality Date   COLONOSCOPY WITH PROPOFOL N/A 09/13/2018   Procedure: COLONOSCOPY WITH PROPOFOL;  Surgeon: Lucilla Lame, MD;  Location: [place];  Service: Endoscopy;  Laterality: N/A;   ESOPHAGOGASTRODUODENOSCOPY (EGD)  WITH PROPOFOL N/A 08/16/2020   Procedure: ESOPHAGOGASTRODUODENOSCOPY (EGD) WITH PROPOFOL;  Surgeon: Lucilla Lame, MD;  Location: [place];  Service: Endoscopy;  Laterality: N/A;   ESOPHAGOGASTRODUODENOSCOPY (EGD) WITH PROPOFOL N/A 10/07/2021   Procedure: ESOPHAGOGASTRODUODENOSCOPY (EGD) WITH PROPOFOL;  Surgeon: Lucilla Lame, MD;  Location: [place];  Service: Endoscopy;  Laterality: N/A;   HERNIA REPAIR Left 2005   left inguinal   PROSTATE SURGERY     SHOULDER SURGERY Left     Home Medications:  Allergies as of 01/14/2023       Reactions   Flomax [tamsulosin]    Dizzy        Medication List        Accurate as of January 14, 2023  1:13 PM. If you have any questions, ask your nurse or doctor.          STOP taking these medications    rosuvastatin 20 MG tablet Commonly known as: CRESTOR Stopped by: Abbie Sons, MD   tadalafil 5 MG tablet Commonly known as: CIALIS Stopped by: Abbie Sons, MD       TAKE these medications    cholecalciferol 1000 units tablet Commonly known as: VITAMIN D Take 1,000 Units by mouth daily.   multivitamin tablet Take 1 tablet by mouth daily.   niacin 500 MG CR capsule Take by mouth.   vitamin C 1000 MG tablet Take by mouth.   vitamin E 1000  UNIT capsule Take by mouth.        Allergies:  Allergies  Allergen Reactions   Flomax [Tamsulosin]     Dizzy     Family History: Family History  Problem Relation Age of Onset   Hypertension Mother    Heart attack Mother    Lung cancer Mother    Arthritis Father    Diabetes Father    Hypertension Sister    Multiple myeloma Sister    Diabetes Maternal Grandmother     Social History:  reports that he has never smoked. He has never used smokeless tobacco. He reports that he does not drink alcohol and does not use drugs.   Physical Exam: BP 116/73   Pulse (!) 58   Ht '5\' 10"'$  (1.778 m)   Wt 175 lb (79.4 kg)   BMI 25.11 kg/m   Constitutional:   Alert and oriented, No acute distress. HEENT: [place] AT, moist mucus membranes.  Trachea midline, no masses. Cardiovascular: No clubbing, cyanosis, or edema. Respiratory: Normal respiratory effort, no increased work of breathing. Psychiatric: Normal mood and affect.   Pertinent Imaging: Images of a KUB performed earlier today were personally reviewed and interpreted.  Nonobstructing left lower pole calculi stable in size compared with KUB 01/13/2022  Assessment & Plan:    1.  Left nephrolithiasis Stable Surveillance recommended  2.  History elevated PSA  3.  BPH with LUTS Stable on tadalafil  Continue annual follow-up   Abbie Sons, MD  [facility] [address], [place], [place] [address] [phone]

## 2023-01-20 ENCOUNTER — Encounter: Payer: Self-pay | Admitting: Gastroenterology

## 2023-01-20 ENCOUNTER — Ambulatory Visit: Payer: Medicare PPO | Admitting: Gastroenterology

## 2023-01-20 DIAGNOSIS — K2270 Barrett's esophagus without dysplasia: Secondary | ICD-10-CM | POA: Diagnosis not present

## 2023-01-20 NOTE — Progress Notes (Signed)
Primary Care Physician: [name], MD  Primary Gastroenterologist:  Dr. Lucilla Lame  Chief Complaint  Patient presents with   Follow-up    HPI: Lucas Christensen is a 72 y.o. male here with a history of Barrett's esophagus.  The patient was under the impression that he needs an upper endoscopy every year for his Barrett's esophagus.  The patient's last upper endoscopy in October 2022 recommended a repeat upper endoscopy in 3 years after the biopsies of the esophagus did not show any sign of dysplasia.  The patient has lost 35 pounds and has been eating differently and reports that he has no further reflux and has stopped taking his PPI.  He does report that he has had a workup on his shoulder that requires surgery for which she states he will be undergoing it in May.  Past Medical History:  Diagnosis Date   Arthritis    left shoulder   Barrett esophagus    BPH (benign prostatic hyperplasia)    Calculus of both kidneys    Chronic prostatitis    Elevated PSA    GERD (gastroesophageal reflux disease)    Hiatal hernia    Hyperlipidemia    Prostatitis    Squamous cell skin cancer    Testicular hypofunction    Wears glasses     Current Outpatient Medications  Medication Sig Dispense Refill   Ascorbic Acid (VITAMIN C) 1000 MG tablet Take by mouth.     cholecalciferol (VITAMIN D) 1000 units tablet Take 1,000 Units by mouth daily.     Multiple Vitamin (MULTIVITAMIN) tablet Take 1 tablet by mouth daily.     niacin 500 MG CR capsule Take by mouth.     vitamin E 1000 UNIT capsule Take by mouth.     No current facility-administered medications for this visit.    Allergies as of 01/20/2023 - Review Complete 01/20/2023  Allergen Reaction Noted   Flomax [tamsulosin]  10/10/2019    ROS:  General: Negative for anorexia, weight loss, fever, chills, fatigue, weakness. ENT: Negative for hoarseness, difficulty swallowing , nasal congestion. CV: Negative for chest pain,  angina, palpitations, dyspnea on exertion, peripheral edema.  Respiratory: Negative for dyspnea at rest, dyspnea on exertion, cough, sputum, wheezing.  GI: See history of present illness. GU:  Negative for dysuria, hematuria, urinary incontinence, urinary frequency, nocturnal urination.  Endo: Negative for unusual weight change.    Physical Examination:   There were no vitals taken for this visit.  General: Well-nourished, well-developed in no acute distress.  Eyes: No icterus. Conjunctivae pink. Extremities: No lower extremity edema. No clubbing or deformities. Neuro: Alert and oriented x 3.  Grossly intact. Skin: Warm and dry, no jaundice.   Psych: Alert and cooperative, normal mood and affect.  Labs:    Imaging Studies: Abdomen 1 view (KUB)  Result Date: 01/15/2023 CLINICAL DATA:  kidney stone EXAM: ABDOMEN - 1 VIEW COMPARISON:  CT abdomen pelvis 09/11/2020, x-ray abdomen 01/13/2022 FINDINGS: The bowel gas pattern is normal. Surgical tacks overlie the pelvis consistent with hernia repair. Likely radiation seeds overlying the mid pelvis/prostate. Calcifications overlie the expected region of the left renal shadow-possible nephrolithiasis. Calcifications overlie the right upper quadrant-possible cholelithiasis. Five non-rib-bearing lumbar vertebral bodies. Multilevel moderate degenerative changes of the spine. IMPRESSION: 1. Likely left nephrolithiasis. 2. Likely cholelithiasis. Electronically Signed   By: Iven Finn M.D.   On: 01/15/2023 23:39    Assessment and Plan:   Lucas Christensen is a 72 y.o. y/o  male who comes in with a history of Barrett's esophagus.  The patient's last upper endoscopy was a year and a half ago and he was under the impression that he needed an upper endoscopy every 3 years.  The patient has been told that that is not the case and due to his extensive span of Barrett's in his esophagus he could consider repeating the upper endoscopy after 2 years which would be in  October of this year he has agreed to that.  There is nothing further that he needs to do at the present time.  The patient has been explained the plan and agrees with it.     Lucilla Lame, MD. [facility]    Note: This dictation was prepared with Dragon dictation along with smaller phrase technology. Any transcriptional errors that result from this process are unintentional.

## 2023-11-10 ENCOUNTER — Other Ambulatory Visit: Payer: Self-pay

## 2023-11-10 ENCOUNTER — Telehealth: Payer: Self-pay

## 2023-11-10 ENCOUNTER — Telehealth: Payer: Self-pay | Admitting: Gastroenterology

## 2023-11-10 DIAGNOSIS — K2270 Barrett's esophagus without dysplasia: Secondary | ICD-10-CM

## 2023-11-10 NOTE — Telephone Encounter (Signed)
Patient called back to speak with Marcelino Duster.

## 2023-11-10 NOTE — Telephone Encounter (Signed)
The patient called in to schedule his EGD and Colonoscopy. Per Dr. Servando Snare I am writing to inform you that the biopsies taken during your recent endoscopic examination showed Barrett's esophagus without any sign of cancer or worrisome changes. Your next upper endoscopy should be in 3 years. The patient would also like a colonoscopy.

## 2023-11-10 NOTE — Telephone Encounter (Signed)
Chart reviewed further.  Per chart note dated 01/20/2023 office visit with Dr. Servando Snare, "Lucas Christensen is a 72 y.o. y/o male who comes in with a history of Barrett's esophagus.  The patient's last upper endoscopy was a year and a half ago and he was under the impression that he needed an upper endoscopy every 3 years.  The patient has been told that that is not the case and due to his extensive span of Barrett's in his esophagus he could consider repeating the upper endoscopy after 2 years which would be in October of this year he has agreed to that".   EGD scheduled 12/28/23 at [facility] with Dr. Servando Snare.

## 2023-12-15 ENCOUNTER — Encounter: Payer: Self-pay | Admitting: Gastroenterology

## 2023-12-28 ENCOUNTER — Ambulatory Visit: Payer: Self-pay | Admitting: Anesthesiology

## 2023-12-28 ENCOUNTER — Encounter
Admission: RE | Disposition: A | Discharge status: Home / Self Care | Payer: Self-pay | Source: Home / Self Care | Attending: Gastroenterology

## 2023-12-28 ENCOUNTER — Ambulatory Visit
Admission: RE | Admit: 2023-12-28 | Discharge: 2023-12-28 | Disposition: A | Discharge status: Home / Self Care | Payer: Medicare PPO | Attending: Gastroenterology | Admitting: Gastroenterology

## 2023-12-28 ENCOUNTER — Encounter: Payer: Self-pay | Admitting: Gastroenterology

## 2023-12-28 ENCOUNTER — Other Ambulatory Visit: Payer: Self-pay

## 2023-12-28 DIAGNOSIS — K219 Gastro-esophageal reflux disease without esophagitis: Secondary | ICD-10-CM | POA: Diagnosis not present

## 2023-12-28 DIAGNOSIS — K2270 Barrett's esophagus without dysplasia: Secondary | ICD-10-CM | POA: Diagnosis present

## 2023-12-28 DIAGNOSIS — K317 Polyp of stomach and duodenum: Secondary | ICD-10-CM | POA: Diagnosis not present

## 2023-12-28 HISTORY — PX: ESOPHAGOGASTRODUODENOSCOPY (EGD) WITH PROPOFOL: SHX5813

## 2023-12-28 SURGERY — ESOPHAGOGASTRODUODENOSCOPY (EGD) WITH PROPOFOL
Anesthesia: General | Site: Mouth

## 2023-12-28 MED ORDER — STERILE WATER FOR IRRIGATION IR SOLN
Status: DC | PRN
  Administered 2023-12-28: 50 mL

## 2023-12-28 MED ORDER — PROPOFOL 10 MG/ML IV BOLUS
INTRAVENOUS | Status: DC | PRN
  Administered 2023-12-28: 25 mg via INTRAVENOUS
  Administered 2023-12-28: 50 mg via INTRAVENOUS
  Administered 2023-12-28: 100 mg via INTRAVENOUS

## 2023-12-28 MED ORDER — SODIUM CHLORIDE 0.9% FLUSH: 3.0000 mL | Freq: Two times a day (BID) | INTRAVENOUS | Status: DC

## 2023-12-28 MED ORDER — SODIUM CHLORIDE 0.9% FLUSH
3.0000 mL | INTRAVENOUS | Status: DC | PRN
Start: 2023-12-28 — End: 2023-12-28

## 2023-12-28 MED ORDER — SODIUM CHLORIDE 0.9 % IV SOLN: INTRAVENOUS | Status: DC

## 2023-12-28 MED ORDER — LIDOCAINE HCL (CARDIAC) PF 100 MG/5ML IV SOSY
PREFILLED_SYRINGE | INTRAVENOUS | Status: DC | PRN
  Administered 2023-12-28: 50 mg via INTRAVENOUS

## 2023-12-28 MED ORDER — PANTOPRAZOLE SODIUM 40 MG PO TBEC: 40.0000 mg | DELAYED_RELEASE_TABLET | Freq: Every day | ORAL | 11 refills | Status: AC

## 2023-12-28 MED ORDER — PROPOFOL 10 MG/ML IV BOLUS
INTRAVENOUS | Status: AC
  Filled 2023-12-28: qty 20

## 2023-12-28 SURGICAL SUPPLY — 6 items
BLOCK BITE 60FR ADLT L/F GRN (MISCELLANEOUS) ×1 IMPLANT
FORCEPS BIOP RAD 4 LRG CAP 4 (CUTTING FORCEPS) IMPLANT
GOWN CVR UNV OPN BCK APRN NK (MISCELLANEOUS) ×2 IMPLANT
KIT PRC NS LF DISP ENDO (KITS) ×1 IMPLANT
MANIFOLD NEPTUNE II (INSTRUMENTS) ×1 IMPLANT
WATER STERILE IRR 250ML POUR (IV SOLUTION) ×1 IMPLANT

## 2023-12-28 NOTE — Transfer of Care (Signed)
Immediate Anesthesia Transfer of Care Note  Patient: Lucas Christensen  Procedure(s) Performed: ESOPHAGOGASTRODUODENOSCOPY (EGD) WITH BIOPSY (Mouth)  Patient Location: PACU  Anesthesia Type: General  Level of Consciousness: awake, alert  and patient cooperative  Airway and Oxygen Therapy: Patient Spontanous Breathing and Patient connected to supplemental oxygen  Post-op Assessment: Post-op Vital signs reviewed, Patient's Cardiovascular Status Stable, Respiratory Function Stable, Patent Airway and No signs of Nausea or vomiting  Post-op Vital Signs: Reviewed and stable  Complications: No notable events documented.

## 2023-12-28 NOTE — Anesthesia Preprocedure Evaluation (Signed)
Anesthesia Evaluation  Patient identified by MRN, date of birth, ID band Patient awake    Reviewed: Allergy & Precautions, H&P , NPO status , Patient's Chart, lab work & pertinent test results  Airway Mallampati: II  TM Distance: >3 FB Neck ROM: full    Dental no notable dental hx.    Pulmonary neg pulmonary ROS   Pulmonary exam normal        Cardiovascular negative cardio ROS Normal cardiovascular exam     Neuro/Psych negative neurological ROS  negative psych ROS   GI/Hepatic Neg liver ROS, hiatal hernia,GERD  ,,  Endo/Other  negative endocrine ROS    Renal/GU negative Renal ROS  negative genitourinary   Musculoskeletal   Abdominal   Peds  Hematology negative hematology ROS (+)   Anesthesia Other Findings Past Medical History: No date: Arthritis     Comment:  left shoulder No date: Barrett esophagus No date: BPH (benign prostatic hyperplasia) No date: Calculus of both kidneys No date: Chronic prostatitis No date: Elevated PSA No date: GERD (gastroesophageal reflux disease) No date: Hiatal hernia No date: Hyperlipidemia No date: Prostatitis No date: 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 No date: Wears glasses  Past Surgical History: 09/13/2018: COLONOSCOPY WITH PROPOFOL; N/A     Comment:  Procedure: COLONOSCOPY WITH PROPOFOL;  Surgeon: Midge Minium, MD;  Location[facility];  Service:               Endoscopy;  Laterality: N/A; 08/16/2020: ESOPHAGOGASTRODUODENOSCOPY (EGD) WITH PROPOFOL; N/A     Comment:  Procedure: ESOPHAGOGASTRODUODENOSCOPY (EGD) WITH               PROPOFOL;  Surgeon: Midge Minium, MD;  Location: [facility];  Service: Endoscopy;  Laterality: N/A; 10/07/2021: ESOPHAGOGASTRODUODENOSCOPY (EGD) WITH PROPOFOL; N/A     Comment:  Procedure: ESOPHAGOGASTRODUODENOSCOPY (EGD) WITH               PROPOFOL;  Surgeon: Midge Minium,  MD;  [facility]: [facility];  Service: Endoscopy;  Laterality: N/A; 2005: HERNIA REPAIR; Left     Comment:  left inguinal No date: PROSTATE SURGERY No date: SHOULDER SURGERY; Left  BMI    Body Mass Index: 27.26 kg/m      Reproductive/Obstetrics negative OB ROS                             Anesthesia Physical Anesthesia Plan  ASA: 2  Anesthesia Plan: General   Post-op Pain Management:    Induction:   PONV Risk Score and Plan: Propofol infusion and TIVA  Airway Management Planned:   Additional Equipment:   Intra-op Plan:   Post-operative Plan:   Informed Consent:      Dental Advisory Given  Plan Discussed with: CRNA and Surgeon  Anesthesia Plan Comments:         Anesthesia Quick Evaluation

## 2023-12-28 NOTE — H&P (Signed)
Midge Minium, MD [facility] [address], [address] [place], [place] [address] Phone:[phone] Fax : [phone]  Primary Care Physician:  Ronnald Ramp, MD Primary Gastroenterologist:  Dr. Servando Snare  Pre-Procedure History & Physical: HPI:  SHANKAR STULL is a 73 y.o. male is here for an endoscopy.   Past Medical History:  Diagnosis Date   Arthritis    left shoulder   Barrett esophagus    BPH (benign prostatic hyperplasia)    Calculus of both kidneys    Chronic prostatitis    Elevated PSA    GERD (gastroesophageal reflux disease)    Hiatal hernia    Hyperlipidemia    Prostatitis    Squamous cell skin cancer    Testicular hypofunction    Wears glasses     Past Surgical History:  Procedure Laterality Date   COLONOSCOPY WITH PROPOFOL N/A 09/13/2018   Procedure: COLONOSCOPY WITH PROPOFOL;  Surgeon: Midge Minium, MD;  Location: [facility];  Service: Endoscopy;  Laterality: N/A;   ESOPHAGOGASTRODUODENOSCOPY (EGD) WITH PROPOFOL N/A 08/16/2020   Procedure: ESOPHAGOGASTRODUODENOSCOPY (EGD) WITH PROPOFOL;  Surgeon: Midge Minium, MD;  Location: [facility];  Service: Endoscopy;  Laterality: N/A;   ESOPHAGOGASTRODUODENOSCOPY (EGD) WITH PROPOFOL N/A 10/07/2021   Procedure: ESOPHAGOGASTRODUODENOSCOPY (EGD) WITH PROPOFOL;  Surgeon: Midge Minium, MD;  Location: [facility];  Service: Endoscopy;  Laterality: N/A;   HERNIA REPAIR Left 2005   left inguinal   PROSTATE SURGERY     SHOULDER SURGERY Left     Prior to Admission medications   Medication Sig Start Date End Date Taking? Authorizing Provider  Ascorbic Acid (VITAMIN C) 1000 MG tablet Take by mouth.   Yes [provider]  cholecalciferol (VITAMIN D) 1000 units tablet Take 1,000 Units by mouth daily.   Yes [provider]  Multiple Vitamin (MULTIVITAMIN) tablet Take 1 tablet by mouth daily.   Yes [provider]  niacin 500 MG CR capsule Take by mouth. 06/08/07  Yes [provider]  vitamin E 1000 UNIT capsule Take by mouth.   Yes [provider]    Allergies as of 11/10/2023 - Review Complete 01/20/2023  Allergen Reaction Noted   Flomax [tamsulosin]  10/10/2019    Family History  Problem Relation Age of Onset   Hypertension Mother    Heart attack Mother    Lung cancer Mother    Arthritis Father    Diabetes Father    Hypertension Sister    Multiple myeloma Sister    Diabetes Maternal Grandmother     Social History   Socioeconomic History   Marital status: Married    Spouse name: Not on file   Number of children: Not on file   Years of education: Not on file   Highest education level: Not on file  Occupational History   Not on file  Tobacco Use   Smoking status: Never   Smokeless tobacco: Never  Vaping Use   Vaping status: Never Used  Substance and Sexual Activity   Alcohol use: No    Comment: occasional wine on Holidays   Drug use: No   Sexual activity: Not on file  Other Topics Concern   Not on file  Social History Narrative   Not on file   Social Drivers of Health   Financial Resource Strain: Not on file  Food Insecurity: Not on file  Transportation Needs: Not on file  Physical Activity: Not on file  Stress: Not on file  Social Connections: Not on file  Intimate Partner Violence: Not on file    Review of Systems: See HPI, otherwise negative ROS  Physical Exam: BP 119/75   Pulse (!) 54   Temp 98 F (36.7 C) (Temporal)   Resp 18   Ht 5\' 10"  (1.778 m)   Wt 91.6 kg   SpO2 95%   BMI 28.98 kg/m  General:   Alert,  pleasant and cooperative in NAD Head:  Normocephalic and atraumatic. Neck:  Supple; no masses or thyromegaly. Lungs:  Clear throughout to auscultation.    Heart:  Regular rate and rhythm. Abdomen:  Soft, nontender and nondistended. Normal bowel sounds, without guarding, and without rebound.   Neurologic:  Alert and  oriented x4;  grossly normal neurologically.  Impression/Plan: JOSELUIS ATZ is here for an endoscopy to be performed for barretts esophagus  Risks, benefits, limitations, and alternatives regarding  endoscopy have been reviewed with the patient.  Questions have been answered.  All parties agreeable.   Midge Minium, MD  12/28/2023, 8:08 AM

## 2023-12-28 NOTE — Anesthesia Postprocedure Evaluation (Signed)
Anesthesia Post Note  Patient: Lucas Christensen  Procedure(s) Performed: ESOPHAGOGASTRODUODENOSCOPY (EGD) WITH BIOPSY (Mouth)  Patient location during evaluation: PACU Anesthesia Type: General Level of consciousness: awake and alert Pain management: pain level controlled Vital Signs Assessment: post-procedure vital signs reviewed and stable Respiratory status: spontaneous breathing, nonlabored ventilation and respiratory function stable Cardiovascular status: blood pressure returned to baseline and stable Postop Assessment: no apparent nausea or vomiting Anesthetic complications: no   No notable events documented.   Last Vitals:  Vitals:   12/28/23 0832 12/28/23 0842  BP: 103/72 105/77  Pulse: 60 69  Resp: 18 14  Temp: (!) 36.4 C   SpO2: (!) 88% 92%    Last Pain:  Vitals:   12/28/23 0842  TempSrc:   PainSc: 0-No pain                 Foye Deer

## 2023-12-28 NOTE — Op Note (Signed)
[facility] Gastroenterology Patient Name: Lucas Christensen Procedure Date: 12/28/2023 8:08 AM MRN: [id number] Account #: [id number] Date of Birth: October 19, 1951 Admit Type: Outpatient Age: 73 Room: [facility] OR ROOM 01 Gender: Male Note Status: Finalized Instrument Name: [id number] Procedure:             Upper GI endoscopy Indications:           Follow-up of Barrett's esophagus Providers:             Midge Minium MD, MD Medicines:             Propofol per Anesthesia Complications:         No immediate complications. Procedure:             Pre-Anesthesia Assessment:                        - Prior to the procedure, a History and Physical was                         performed, and patient medications and allergies were                         reviewed. The patient's tolerance of previous                         anesthesia was also reviewed. The risks and benefits                         of the procedure and the sedation options and risks                         were discussed with the patient. All questions were                         answered, and informed consent was obtained. Prior                         Anticoagulants: The patient has taken no anticoagulant                         or antiplatelet agents. ASA Grade Assessment: II - A                         patient with mild systemic disease. After reviewing                         the risks and benefits, the patient was deemed in                         satisfactory condition to undergo the procedure.                        After obtaining informed consent, the endoscope was                         passed under direct vision. Throughout the procedure,                         the patient's blood  pressure, pulse, and oxygen                         saturations were monitored continuously. The Endoscope                         was introduced through the mouth, and advanced to the                         second part of duodenum.  The upper GI endoscopy was                         accomplished without difficulty. The patient tolerated                         the procedure well. Findings:      There were esophageal mucosal changes consistent with long-segment       Barrett's esophagus present in the lower third of the esophagus. The       maximum longitudinal extent of these mucosal changes was 11 cm in       length. Mucosa was biopsied with a cold forceps for histology in 4       quadrants at intervals of 1 cm from 24 to 35 cm from the incisors.      The stomach was normal.      A single 7 mm sessile polyp was found in the duodenal bulb. Biopsies       were taken with a cold forceps for histology. Impression:            - Esophageal mucosal changes consistent with                         long-segment Barrett's esophagus. Biopsied.                        - Normal stomach.                        - A single duodenal polyp. Biopsied. Recommendation:        - Discharge patient to home.                        - Resume previous diet.                        - Continue present medications.                        - Await pathology results. Procedure Code(s):     --- Professional ---                        [phone], Esophagogastroduodenoscopy, flexible,                         transoral; with biopsy, single or multiple Diagnosis Code(s):     --- Professional ---                        K22.70, Barrett's esophagus without dysplasia  K31.7, Polyp of stomach and duodenum CPT copyright 2022 American Medical Association. All rights reserved. The codes documented in this report are preliminary and upon coder review may  be revised to meet current compliance requirements. Midge Minium MD, MD 12/28/2023 8:29:58 AM This report has been signed electronically. Number of Addenda: 0 Note Initiated On: 12/28/2023 8:08 AM Total Procedure Duration: 0 hours 12 minutes 11 seconds  Estimated Blood Loss:  Estimated blood  loss: none.      [facility]

## 2023-12-29 ENCOUNTER — Encounter: Payer: Self-pay | Admitting: Gastroenterology

## 2023-12-29 LAB — SURGICAL PATHOLOGY

## 2023-12-30 ENCOUNTER — Encounter: Payer: Self-pay | Admitting: Gastroenterology

## 2024-01-14 ENCOUNTER — Ambulatory Visit: Payer: Medicare PPO | Admitting: Urology

## 2024-02-03 ENCOUNTER — Other Ambulatory Visit: Payer: Self-pay | Admitting: *Deleted

## 2024-02-03 DIAGNOSIS — N200 Calculus of kidney: Secondary | ICD-10-CM

## 2024-02-05 ENCOUNTER — Ambulatory Visit
Admission: RE | Admit: 2024-02-05 | Discharge: 2024-02-05 | Disposition: A | Discharge status: Home / Self Care | Payer: Medicare PPO | Source: Ambulatory Visit | Attending: Urology | Admitting: Urology

## 2024-02-05 ENCOUNTER — Encounter: Payer: Self-pay | Admitting: Urology

## 2024-02-05 ENCOUNTER — Other Ambulatory Visit: Payer: Self-pay

## 2024-02-05 ENCOUNTER — Ambulatory Visit: Payer: Medicare PPO | Admitting: Urology

## 2024-02-05 VITALS — BP 138/89 | HR 73 | Ht 70.0 in | Wt 195.0 lb

## 2024-02-05 DIAGNOSIS — N401 Enlarged prostate with lower urinary tract symptoms: Secondary | ICD-10-CM

## 2024-02-05 DIAGNOSIS — N201 Calculus of ureter: Secondary | ICD-10-CM

## 2024-02-05 DIAGNOSIS — N23 Unspecified renal colic: Secondary | ICD-10-CM

## 2024-02-05 DIAGNOSIS — N200 Calculus of kidney: Secondary | ICD-10-CM | POA: Insufficient documentation

## 2024-02-05 DIAGNOSIS — Z87898 Personal history of other specified conditions: Secondary | ICD-10-CM

## 2024-02-05 LAB — URINALYSIS, COMPLETE
Bilirubin, UA: NEGATIVE
Glucose, UA: NEGATIVE
Nitrite, UA: POSITIVE — AB
Specific Gravity, UA: >1.03 — ABNORMAL HIGH (ref 1.005–1.030)
Urobilinogen, Ur: 0.2 mg/dL (ref 0.2–1.0)
pH, UA: 6 (ref 5.0–7.5)

## 2024-02-05 LAB — MICROSCOPIC EXAMINATION: RBC, Urine: >30 /[HPF] — AB (ref 0–2)

## 2024-02-05 MED ORDER — ALFUZOSIN HCL ER 10 MG PO TB24: 10.0000 mg | ORAL_TABLET | Freq: Every day | ORAL | 0 refills | Status: DC

## 2024-02-05 MED ORDER — OXYCODONE-ACETAMINOPHEN 5-325 MG PO TABS: 1.0000 | ORAL_TABLET | Freq: Four times a day (QID) | ORAL | 0 refills | Status: DC | PRN

## 2024-02-05 MED ORDER — KETOROLAC TROMETHAMINE 60 MG/2ML IM SOLN
30.0000 mg | Freq: Once | INTRAMUSCULAR | Status: AC
  Administered 2024-02-05: 30 mg via INTRAMUSCULAR

## 2024-02-05 MED ORDER — ONDANSETRON 4 MG PO TBDP: 4.0000 mg | ORAL_TABLET | Freq: Three times a day (TID) | ORAL | 0 refills | Status: DC | PRN

## 2024-02-05 NOTE — Progress Notes (Signed)
 I, Maysun Anabel Bene, acting as a scribe for Riki Altes, MD., have documented all relevant documentation on the behalf of Riki Altes, MD, as directed by Riki Altes, MD while in the presence of Riki Altes, MD.  02/05/2024 1:18 PM   [name] [birth year] [id number]  Referring provider: Ronnald Ramp, MD [address] [place],  [place] [address]  Chief Complaint  Patient presents with   Follow-up   Urologic history: 1.  Nephrolithiasis status post left ureteroscopic stone removal May 2014 CT 09/2020 nonobstructing left lower pole calculi   2.  History elevated PSA Baseline PSA upper 1/low 2 range; intermittent elevation as high as 6.1 No previous biopsy   3.  BPH with lower urinary tract symptoms Status post UroLift in [place] 2018 On tadalafil 5 mg daily for mild to moderate LUTS Unable to tolerate alpha-blocker secondary to orthostatic symptoms   4.  Episode acute prostatitis September 2019  HPI: RASHAWD LASKARIS is a 73 y.o. male presents for annual follow-up.  Yesterday, he had onset of left flank pain radiating to the left lower quadrant. Presently, the pain is described as severe and is intermittently sharp and stabbing, and he has not been able to get comfortable. He has had some urinary frequency and urgency over the past few weeks. He has nausea without vomiting, denies fever or chills. KUB was performed earlier today.  His PSA was checked by his PCP in April 2024 and October 2024 and was stable at 2.82 and 3.31, respectively.   PMH: Past Medical History:  Diagnosis Date   Arthritis    left shoulder   Barrett esophagus    BPH (benign prostatic hyperplasia)    Calculus of both kidneys    Chronic prostatitis    Elevated PSA    GERD (gastroesophageal reflux disease)    Hiatal hernia    Hyperlipidemia    Prostatitis    Squamous cell skin cancer    Testicular hypofunction    Wears glasses     Surgical  History: Past Surgical History:  Procedure Laterality Date   COLONOSCOPY WITH PROPOFOL N/A 09/13/2018   Procedure: COLONOSCOPY WITH PROPOFOL;  Surgeon: Midge Minium, MD;  Location: [facility];  Service: Endoscopy;  Laterality: N/A;   ESOPHAGOGASTRODUODENOSCOPY (EGD) WITH PROPOFOL N/A 08/16/2020   Procedure: ESOPHAGOGASTRODUODENOSCOPY (EGD) WITH PROPOFOL;  Surgeon: Midge Minium, MD;  Location: [facility];  Service: Endoscopy;  Laterality: N/A;   ESOPHAGOGASTRODUODENOSCOPY (EGD) WITH PROPOFOL N/A 10/07/2021   Procedure: ESOPHAGOGASTRODUODENOSCOPY (EGD) WITH PROPOFOL;  Surgeon: Midge Minium, MD;  Location: [facility];  Service: Endoscopy;  Laterality: N/A;   ESOPHAGOGASTRODUODENOSCOPY (EGD) WITH PROPOFOL N/A 12/28/2023   Procedure: ESOPHAGOGASTRODUODENOSCOPY (EGD) WITH BIOPSY;  Surgeon: Midge Minium, MD;  Location: [facility];  Service: Endoscopy;  Laterality: N/A;   HERNIA REPAIR Left 2005   left inguinal   PROSTATE SURGERY     SHOULDER SURGERY Left     Home Medications:  Allergies as of 02/05/2024       Reactions   Flomax [tamsulosin]    Dizzy        Medication List        Accurate as of February 05, 2024  1:18 PM. If you have any questions, ask your nurse or doctor.          alfuzosin 10 MG 24 hr tablet Commonly known as: UROXATRAL Take 1 tablet (10 mg total) by mouth daily with breakfast. Started by: Lorin Picket  C Ansel Ferrall   cholecalciferol 1000 units tablet Commonly known as: VITAMIN D Take 1,000 Units by mouth daily.   multivitamin tablet Take 1 tablet by mouth daily.   niacin 500 MG CR capsule Take by mouth.   ondansetron 4 MG disintegrating tablet Commonly known as: ZOFRAN-ODT Take 1 tablet (4 mg total) by mouth every 8 (eight) hours as needed for nausea or vomiting. Started by: Riki Altes   oxyCODONE-acetaminophen 5-325 MG tablet Commonly known as: PERCOCET/ROXICET Take 1 tablet by mouth every 6 (six) hours as needed for  severe pain (pain score 7-10). Started by: Riki Altes   pantoprazole 40 MG tablet Commonly known as: PROTONIX Take 1 tablet (40 mg total) by mouth daily.   vitamin C 1000 MG tablet Take by mouth.   vitamin E 1000 UNIT capsule Take by mouth.        Allergies:  Allergies  Allergen Reactions   Flomax [Tamsulosin]     Dizzy     Family History: Family History  Problem Relation Age of Onset   Hypertension Mother    Heart attack Mother    Lung cancer Mother    Arthritis Father    Diabetes Father    Hypertension Sister    Multiple myeloma Sister    Diabetes Maternal Grandmother     Social History:  reports that he has never smoked. He has never used smokeless tobacco. He reports that he does not drink alcohol and does not use drugs.   Physical Exam: BP 138/89   Pulse 73   Ht 5\' 10"  (1.778 m)   Wt 195 lb (88.5 kg)   BMI 27.98 kg/m   Constitutional:  Alert and oriented, moderate distress  secondary to flank pain. HEENT: [place] AT. Respiratory: Normal respiratory effort, no increased work of breathing. GU: Prostate 50 grams, smooth without nodules.  Psychiatric: Normal mood and affect.   Pertinent Imaging: KUB was personally reviewed and interpreted. There has been migration of a previously identified left lower pole calculus to the proximal ureter in measures 5 x 9 mm.    Assessment & Plan:    1. Renal colic Secondary to migrated calculus to the left proximal ureter.  Urinalysis ordered Given a Toradol 30 mg IM. Rx Percocet and Zofran sent to pharmacy.  We discussed various treatment options for urolithiasis including observation with or without medical expulsive therapy, shockwave lithotripsy (SWL), ureteroscopy and laser lithotripsy with stent placement. We discussed that management is based on stone size, location, density, patient co-morbidities, and patient preference.  Stones <73mm in size have a >80% spontaneous passage rate. Data surrounding the use  of tamsulosin for medical expulsive therapy is controversial, but meta analyses suggests it is most efficacious for distal stones between 5-41mm in size. Possible side effects include dizziness/lightheadedness, and retrograde ejaculation. SWL has a lower stone free rate in a single procedure, but also a lower complication rate compared to ureteroscopy and avoids a stent and associated stent related symptoms. Possible complications include renal hematoma, steinstrasse, and need for additional treatment. Ureteroscopy with laser lithotripsy and stent placement has a higher stone free rate than SWL in a single procedure, however increased complication rate including possible infection, ureteral injury, bleeding, and stent related morbidity. Common stent related symptoms include dysuria, urgency/frequency, and flank pain. After an extensive discussion of the risks and benefits of the above treatment options, the patient would like to proceed with ureteroscopy 02/09/24  He was instructed to proceed to the ED if he develops  a fever greater than 100.5 degrees or intractable pain.  2. History elevated PSA  3. BPH with LUTS Stable.  I have reviewed the above documentation for accuracy and completeness, and I agree with the above.   Riki Altes, MD  [facility] [address], [address] [place], [place] [address] [phone]

## 2024-02-05 NOTE — Progress Notes (Signed)
 Surgical Physician Order Form [facility]   Dr. Irineo Axon, MD  * Scheduling expectation : Tuesday 3/4  *Length of Case: 60 minutes  *Clearance needed: no  *Anticoagulation Instructions: N/A  *Aspirin Instructions: N/A  *Post-op visit Date/Instructions:  1 week cysto stent removal  *Diagnosis: Left Ureteral Stone  *Procedure: left Ureteroscopy w/laser lithotripsy & stent placement (40086)   Additional orders: N/A  -Admit type: OUTpatient  -Anesthesia: General  -VTE Prophylaxis Standing Order SCD's       Other:   -Standing Lab Orders Per Anesthesia    Lab other: UA&Urine Culture-ordered -2/28  -Standing Test orders EKG/Chest x-ray per Anesthesia       Test other:   - Medications:  Ceftriaxone(Rocephin) 1gm IV  -Other orders:  N/A

## 2024-02-05 NOTE — H&P (View-Only) (Signed)
 I, Maysun Anabel Bene, acting as a scribe for Riki Altes, MD., have documented all relevant documentation on the behalf of Riki Altes, MD, as directed by Riki Altes, MD while in the presence of Riki Altes, MD.  02/05/2024 1:18 PM   Lucas Christensen [birth year] [id number]  Referring provider: Ronnald Ramp, MD [address] [place],  [place] [address]  Chief Complaint  Patient presents with   Follow-up   Urologic history: 1.  Nephrolithiasis status post left ureteroscopic stone removal May 2014 CT 09/2020 nonobstructing left lower pole calculi   2.  History elevated PSA Baseline PSA upper 1/low 2 range; intermittent elevation as high as 6.1 No previous biopsy   3.  BPH with lower urinary tract symptoms Status post UroLift in [place] 2018 On tadalafil 5 mg daily for mild to moderate LUTS Unable to tolerate alpha-blocker secondary to orthostatic symptoms   4.  Episode acute prostatitis September 2019  HPI: Lucas Christensen is a 73 y.o. male presents for annual follow-up.  Yesterday, he had onset of left flank pain radiating to the left lower quadrant. Presently, the pain is described as severe and is intermittently sharp and stabbing, and he has not been able to get comfortable. He has had some urinary frequency and urgency over the past few weeks. He has nausea without vomiting, denies fever or chills. KUB was performed earlier today.  His PSA was checked by his PCP in April 2024 and October 2024 and was stable at 2.82 and 3.31, respectively.   PMH: Past Medical History:  Diagnosis Date   Arthritis    left shoulder   Barrett esophagus    BPH (benign prostatic hyperplasia)    Calculus of both kidneys    Chronic prostatitis    Elevated PSA    GERD (gastroesophageal reflux disease)    Hiatal hernia    Hyperlipidemia    Prostatitis    Squamous cell skin cancer    Testicular hypofunction    Wears glasses     Surgical  History: Past Surgical History:  Procedure Laterality Date   COLONOSCOPY WITH PROPOFOL N/A 09/13/2018   Procedure: COLONOSCOPY WITH PROPOFOL;  Surgeon: Midge Minium, MD;  Location: [facility];  Service: Endoscopy;  Laterality: N/A;   ESOPHAGOGASTRODUODENOSCOPY (EGD) WITH PROPOFOL N/A 08/16/2020   Procedure: ESOPHAGOGASTRODUODENOSCOPY (EGD) WITH PROPOFOL;  Surgeon: Midge Minium, MD;  Location: [facility];  Service: Endoscopy;  Laterality: N/A;   ESOPHAGOGASTRODUODENOSCOPY (EGD) WITH PROPOFOL N/A 10/07/2021   Procedure: ESOPHAGOGASTRODUODENOSCOPY (EGD) WITH PROPOFOL;  Surgeon: Midge Minium, MD;  Location: [facility];  Service: Endoscopy;  Laterality: N/A;   ESOPHAGOGASTRODUODENOSCOPY (EGD) WITH PROPOFOL N/A 12/28/2023   Procedure: ESOPHAGOGASTRODUODENOSCOPY (EGD) WITH BIOPSY;  Surgeon: Midge Minium, MD;  Location: [facility];  Service: Endoscopy;  Laterality: N/A;   HERNIA REPAIR Left 2005   left inguinal   PROSTATE SURGERY     SHOULDER SURGERY Left     Home Medications:  Allergies as of 02/05/2024       Reactions   Flomax [tamsulosin]    Dizzy        Medication List        Accurate as of February 05, 2024  1:18 PM. If you have any questions, ask your nurse or doctor.          alfuzosin 10 MG 24 hr tablet Commonly known as: UROXATRAL Take 1 tablet (10 mg total) by mouth daily with breakfast. Started by: Lorin Picket  C Ansel Ferrall   cholecalciferol 1000 units tablet Commonly known as: VITAMIN D Take 1,000 Units by mouth daily.   multivitamin tablet Take 1 tablet by mouth daily.   niacin 500 MG CR capsule Take by mouth.   ondansetron 4 MG disintegrating tablet Commonly known as: ZOFRAN-ODT Take 1 tablet (4 mg total) by mouth every 8 (eight) hours as needed for nausea or vomiting. Started by: Riki Altes   oxyCODONE-acetaminophen 5-325 MG tablet Commonly known as: PERCOCET/ROXICET Take 1 tablet by mouth every 6 (six) hours as needed for  severe pain (pain score 7-10). Started by: Riki Altes   pantoprazole 40 MG tablet Commonly known as: PROTONIX Take 1 tablet (40 mg total) by mouth daily.   vitamin C 1000 MG tablet Take by mouth.   vitamin E 1000 UNIT capsule Take by mouth.        Allergies:  Allergies  Allergen Reactions   Flomax [Tamsulosin]     Dizzy     Family History: Family History  Problem Relation Age of Onset   Hypertension Mother    Heart attack Mother    Lung cancer Mother    Arthritis Father    Diabetes Father    Hypertension Sister    Multiple myeloma Sister    Diabetes Maternal Grandmother     Social History:  reports that he has never smoked. He has never used smokeless tobacco. He reports that he does not drink alcohol and does not use drugs.   Physical Exam: BP 138/89   Pulse 73   Ht 5\' 10"  (1.778 m)   Wt 195 lb (88.5 kg)   BMI 27.98 kg/m   Constitutional:  Alert and oriented, moderate distress  secondary to flank pain. HEENT: [place] AT. Respiratory: Normal respiratory effort, no increased work of breathing. GU: Prostate 50 grams, smooth without nodules.  Psychiatric: Normal mood and affect.   Pertinent Imaging: KUB was personally reviewed and interpreted. There has been migration of a previously identified left lower pole calculus to the proximal ureter in measures 5 x 9 mm.    Assessment & Plan:    1. Renal colic Secondary to migrated calculus to the left proximal ureter.  Urinalysis ordered Given a Toradol 30 mg IM. Rx Percocet and Zofran sent to pharmacy.  We discussed various treatment options for urolithiasis including observation with or without medical expulsive therapy, shockwave lithotripsy (SWL), ureteroscopy and laser lithotripsy with stent placement. We discussed that management is based on stone size, location, density, patient co-morbidities, and patient preference.  Stones <73mm in size have a >80% spontaneous passage rate. Data surrounding the use  of tamsulosin for medical expulsive therapy is controversial, but meta analyses suggests it is most efficacious for distal stones between 5-41mm in size. Possible side effects include dizziness/lightheadedness, and retrograde ejaculation. SWL has a lower stone free rate in a single procedure, but also a lower complication rate compared to ureteroscopy and avoids a stent and associated stent related symptoms. Possible complications include renal hematoma, steinstrasse, and need for additional treatment. Ureteroscopy with laser lithotripsy and stent placement has a higher stone free rate than SWL in a single procedure, however increased complication rate including possible infection, ureteral injury, bleeding, and stent related morbidity. Common stent related symptoms include dysuria, urgency/frequency, and flank pain. After an extensive discussion of the risks and benefits of the above treatment options, the patient would like to proceed with ureteroscopy 02/09/24  He was instructed to proceed to the ED if he develops  a fever greater than 100.5 degrees or intractable pain.  2. History elevated PSA  3. BPH with LUTS Stable.  I have reviewed the above documentation for accuracy and completeness, and I agree with the above.   Riki Altes, MD  [facility] [address], [address] [place], [place] [address] [phone]

## 2024-02-06 ENCOUNTER — Encounter: Payer: Self-pay | Admitting: Urology

## 2024-02-08 ENCOUNTER — Telehealth: Payer: Self-pay

## 2024-02-08 LAB — CULTURE, URINE COMPREHENSIVE

## 2024-02-08 NOTE — Progress Notes (Signed)
   [facility] Surgical Posting Form  Surgery Date: Date: 02/09/2024  Surgeon: Dr. Irineo Axon, MD  Inpt ( No  )   Outpt (Yes)   Obs ( No  )   Diagnosis: N20.1 Left Ureteral Stone  -CPT: [phone]  Surgery: Left Ureteroscopy with Laser Lithotripsy and Stent Placement  Stop Anticoagulations: Yes and hold ASA  Cardiac/Medical/Pulmonary Clearance needed: no  *Orders entered into EPIC  Date: 02/08/24   *Case booked in [place]  Date: 02/08/24  *Notified pt of Surgery: Date: 02/08/24  PRE-OP UA & CX: yes, obtained in clinic on 02/05/2024  *Placed into Prior Authorization Work Angela Nevin Date: 02/08/24  Assistant/laser/rep:No

## 2024-02-08 NOTE — Telephone Encounter (Signed)
  Per Dr. Lonna Cobb, Patient is to be scheduled for Left Ureteroscopy with Laser Lithotripsy and Stent Placement   Mr. Burkett was contacted and possible surgical dates were discussed, Tuesday March 4th, 2025 was agreed upon for surgery.   Patient was directed to call [phone] between 1-3pm the day before surgery to find out surgical arrival time.  Instructions were given not to eat or drink from midnight on the night before surgery and have a driver for the day of surgery. On the surgery day patient was instructed to enter through the [facility] entrance of [facility] report the Same Day Surgery desk.   Pre-Admit Testing will be in contact via phone to set up an interview with the anesthesia team to review your history and medications prior to surgery.   Reminder of this information was sent via MyChart to the patient.

## 2024-02-09 ENCOUNTER — Other Ambulatory Visit: Payer: Self-pay

## 2024-02-09 ENCOUNTER — Ambulatory Visit: Admitting: Certified Registered"

## 2024-02-09 ENCOUNTER — Encounter
Admission: RE | Disposition: A | Discharge status: Home / Self Care | Payer: Self-pay | Source: Home / Self Care | Attending: Urology

## 2024-02-09 ENCOUNTER — Encounter: Payer: Self-pay | Admitting: Urology

## 2024-02-09 ENCOUNTER — Ambulatory Visit

## 2024-02-09 ENCOUNTER — Ambulatory Visit
Admission: RE | Admit: 2024-02-09 | Discharge: 2024-02-09 | Disposition: A | Discharge status: Home / Self Care | Attending: Urology | Admitting: Urology

## 2024-02-09 DIAGNOSIS — R3915 Urgency of urination: Secondary | ICD-10-CM | POA: Diagnosis not present

## 2024-02-09 DIAGNOSIS — N401 Enlarged prostate with lower urinary tract symptoms: Secondary | ICD-10-CM | POA: Insufficient documentation

## 2024-02-09 DIAGNOSIS — K449 Diaphragmatic hernia without obstruction or gangrene: Secondary | ICD-10-CM | POA: Diagnosis not present

## 2024-02-09 DIAGNOSIS — I10 Essential (primary) hypertension: Secondary | ICD-10-CM | POA: Insufficient documentation

## 2024-02-09 DIAGNOSIS — Z87442 Personal history of urinary calculi: Secondary | ICD-10-CM | POA: Insufficient documentation

## 2024-02-09 DIAGNOSIS — Z79899 Other long term (current) drug therapy: Secondary | ICD-10-CM | POA: Insufficient documentation

## 2024-02-09 DIAGNOSIS — K219 Gastro-esophageal reflux disease without esophagitis: Secondary | ICD-10-CM | POA: Diagnosis not present

## 2024-02-09 DIAGNOSIS — R350 Frequency of micturition: Secondary | ICD-10-CM | POA: Insufficient documentation

## 2024-02-09 DIAGNOSIS — N202 Calculus of kidney with calculus of ureter: Secondary | ICD-10-CM | POA: Diagnosis present

## 2024-02-09 DIAGNOSIS — Z833 Family history of diabetes mellitus: Secondary | ICD-10-CM | POA: Insufficient documentation

## 2024-02-09 DIAGNOSIS — N201 Calculus of ureter: Secondary | ICD-10-CM

## 2024-02-09 HISTORY — PX: CYSTOSCOPY/URETEROSCOPY/HOLMIUM LASER/STENT PLACEMENT: SHX6546

## 2024-02-09 SURGERY — CYSTOSCOPY/URETEROSCOPY/HOLMIUM LASER/STENT PLACEMENT
Anesthesia: General | Site: Ureter | Laterality: Left

## 2024-02-09 MED ORDER — SODIUM CHLORIDE 0.9 % IV SOLN
1.0000 g | INTRAVENOUS | Status: AC
  Administered 2024-02-09: 1 g via INTRAVENOUS
  Filled 2024-02-09: qty 10

## 2024-02-09 MED ORDER — FENTANYL CITRATE (PF) 100 MCG/2ML IJ SOLN
INTRAMUSCULAR | Status: AC
  Filled 2024-02-09: qty 2

## 2024-02-09 MED ORDER — SODIUM CHLORIDE 0.9 % IR SOLN
Status: DC | PRN
  Administered 2024-02-09: 3000 mL via INTRAVESICAL

## 2024-02-09 MED ORDER — ORAL CARE MOUTH RINSE: 15.0000 mL | Freq: Once | OROMUCOSAL | Status: AC

## 2024-02-09 MED ORDER — OXYCODONE-ACETAMINOPHEN 5-325 MG PO TABS: 1.0000 | ORAL_TABLET | Freq: Four times a day (QID) | ORAL | 0 refills | Status: DC | PRN

## 2024-02-09 MED ORDER — DEXAMETHASONE SODIUM PHOSPHATE 10 MG/ML IJ SOLN
INTRAMUSCULAR | Status: DC | PRN
  Administered 2024-02-09: 5 mg via INTRAVENOUS

## 2024-02-09 MED ORDER — LIDOCAINE HCL (CARDIAC) PF 100 MG/5ML IV SOSY
PREFILLED_SYRINGE | INTRAVENOUS | Status: DC | PRN
  Administered 2024-02-09: 80 mg via INTRAVENOUS

## 2024-02-09 MED ORDER — IOHEXOL 180 MG/ML  SOLN
INTRAMUSCULAR | Status: DC | PRN
  Administered 2024-02-09: 10 mL

## 2024-02-09 MED ORDER — MIDAZOLAM HCL 2 MG/2ML IJ SOLN
INTRAMUSCULAR | Status: AC
Start: 2024-02-09 — End: ?
  Filled 2024-02-09: qty 2

## 2024-02-09 MED ORDER — OXYCODONE HCL 5 MG PO TABS
ORAL_TABLET | ORAL | Status: AC
Start: 2024-02-09 — End: ?
  Filled 2024-02-09: qty 1

## 2024-02-09 MED ORDER — PROPOFOL 10 MG/ML IV BOLUS
INTRAVENOUS | Status: DC | PRN
  Administered 2024-02-09: 120 mg via INTRAVENOUS
  Administered 2024-02-09 (×2): 20 mg via INTRAVENOUS

## 2024-02-09 MED ORDER — LACTATED RINGERS IV SOLN: INTRAVENOUS | Status: DC | PRN

## 2024-02-09 MED ORDER — DEXMEDETOMIDINE HCL IN NACL 80 MCG/20ML IV SOLN
INTRAVENOUS | Status: DC | PRN
  Administered 2024-02-09 (×2): 4 ug via INTRAVENOUS

## 2024-02-09 MED ORDER — SODIUM CHLORIDE 0.9 % IV SOLN: INTRAVENOUS | Status: DC

## 2024-02-09 MED ORDER — STERILE WATER FOR IRRIGATION IR SOLN
Status: DC | PRN
  Administered 2024-02-09: 500 mL

## 2024-02-09 MED ORDER — ONDANSETRON HCL 4 MG/2ML IJ SOLN
INTRAMUSCULAR | Status: DC | PRN
  Administered 2024-02-09: 4 mg via INTRAVENOUS

## 2024-02-09 MED ORDER — DEXMEDETOMIDINE HCL IN NACL 80 MCG/20ML IV SOLN
INTRAVENOUS | Status: AC
  Filled 2024-02-09: qty 20

## 2024-02-09 MED ORDER — OXYCODONE HCL 5 MG/5ML PO SOLN: 5.0000 mg | Freq: Once | ORAL | Status: AC | PRN

## 2024-02-09 MED ORDER — FENTANYL CITRATE (PF) 100 MCG/2ML IJ SOLN
INTRAMUSCULAR | Status: DC | PRN
  Administered 2024-02-09 (×2): 25 ug via INTRAVENOUS
  Administered 2024-02-09: 50 ug via INTRAVENOUS

## 2024-02-09 MED ORDER — CHLORHEXIDINE GLUCONATE 0.12 % MT SOLN
OROMUCOSAL | Status: AC
  Filled 2024-02-09: qty 15

## 2024-02-09 MED ORDER — OXYCODONE HCL 5 MG PO TABS
5.0000 mg | ORAL_TABLET | Freq: Once | ORAL | Status: AC | PRN
  Administered 2024-02-09: 5 mg via ORAL

## 2024-02-09 MED ORDER — OXYBUTYNIN CHLORIDE 5 MG PO TABS
ORAL_TABLET | ORAL | 0 refills | Status: DC
Start: 2024-02-09 — End: 2024-02-18

## 2024-02-09 MED ORDER — MIDAZOLAM HCL 2 MG/2ML IJ SOLN
INTRAMUSCULAR | Status: DC | PRN
  Administered 2024-02-09: 1 mg via INTRAVENOUS

## 2024-02-09 MED ORDER — CHLORHEXIDINE GLUCONATE 0.12 % MT SOLN
15.0000 mL | Freq: Once | OROMUCOSAL | Status: AC
  Administered 2024-02-09: 15 mL via OROMUCOSAL

## 2024-02-09 MED ORDER — FENTANYL CITRATE (PF) 100 MCG/2ML IJ SOLN: 25.0000 ug | INTRAMUSCULAR | Status: DC | PRN

## 2024-02-09 SURGICAL SUPPLY — 23 items
BAG DRAIN SIEMENS DORNER NS (MISCELLANEOUS) ×1 IMPLANT
BASKET NITINOL 4 WIRE 16 (BASKET) IMPLANT
BASKET ZERO TIP 1.9FR (BASKET) IMPLANT
BRUSH SCRUB EZ 1% IODOPHOR (MISCELLANEOUS) ×1 IMPLANT
CATH URET FLEX-TIP 2 LUMEN 10F (CATHETERS) IMPLANT
CATH URETL OPEN END 6X70 (CATHETERS) IMPLANT
CNTNR URN SCR LID CUP LEK RST (MISCELLANEOUS) IMPLANT
DRAPE UTILITY 15X26 TOWEL STRL (DRAPES) ×1 IMPLANT
FIBER LASER MOSES 200 DFL (Laser) IMPLANT
GLOVE BIOGEL PI IND STRL 7.5 (GLOVE) ×1 IMPLANT
GOWN STRL REUS W/ TWL LRG LVL3 (GOWN DISPOSABLE) ×1 IMPLANT
GOWN STRL REUS W/ TWL XL LVL3 (GOWN DISPOSABLE) ×1 IMPLANT
GUIDEWIRE STR DUAL SENSOR (WIRE) ×1 IMPLANT
IV NS IRRIG 3000ML ARTHROMATIC (IV SOLUTION) ×1 IMPLANT
KIT TURNOVER CYSTO (KITS) ×1 IMPLANT
PACK CYSTO AR (MISCELLANEOUS) ×1 IMPLANT
SET CYSTO W/LG BORE CLAMP LF (SET/KITS/TRAYS/PACK) ×1 IMPLANT
SHEATH NAVIGATOR HD 12/14X36 (SHEATH) IMPLANT
STENT URET 6FRX24 CONTOUR (STENTS) IMPLANT
STENT URET 6FRX26 CONTOUR (STENTS) IMPLANT
SURGILUBE 2OZ TUBE FLIPTOP (MISCELLANEOUS) ×1 IMPLANT
VALVE UROSEAL ADJ ENDO (VALVE) IMPLANT
WATER STERILE IRR 500ML POUR (IV SOLUTION) ×1 IMPLANT

## 2024-02-09 NOTE — Anesthesia Preprocedure Evaluation (Addendum)
 Anesthesia Evaluation  Patient identified by MRN, date of birth, ID band Patient awake    Reviewed: Allergy & Precautions, NPO status , Patient's Chart, lab work & pertinent test results  History of Anesthesia Complications Negative for: history of anesthetic complications  Airway Mallampati: III  TM Distance: <3 FB Neck ROM: full    Dental  (+) Chipped   Pulmonary neg pulmonary ROS, neg shortness of breath   Pulmonary exam normal        Cardiovascular hypertension, (-) angina (-) Past MI Normal cardiovascular exam     Neuro/Psych  Neuromuscular disease  negative psych ROS   GI/Hepatic Neg liver ROS, hiatal hernia,GERD  Controlled,,  Endo/Other  negative endocrine ROS    Renal/GU Renal disease     Musculoskeletal   Abdominal   Peds  Hematology negative hematology ROS (+)   Anesthesia Other Findings Past Medical History: No date: Arthritis     Comment:  left shoulder No date: Barrett esophagus No date: BPH (benign prostatic hyperplasia) No date: Calculus of both kidneys No date: Chronic prostatitis No date: Elevated PSA No date: GERD (gastroesophageal reflux disease) No date: Hiatal hernia No date: Hyperlipidemia No date: Prostatitis No date: 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 No date: Wears glasses  Past Surgical History: 09/13/2018: COLONOSCOPY WITH PROPOFOL; N/A     Comment:  Procedure: COLONOSCOPY WITH PROPOFOL;  Surgeon: Midge Minium, MD;  Location: [facility];  Service:               Endoscopy;  Laterality: N/A; 08/16/2020: ESOPHAGOGASTRODUODENOSCOPY (EGD) WITH PROPOFOL; N/A     Comment:  Procedure: ESOPHAGOGASTRODUODENOSCOPY (EGD) WITH               PROPOFOL;  Surgeon: Midge Minium, MD;  Location: [facility];  Service: Endoscopy;  Laterality: N/A; 10/07/2021: ESOPHAGOGASTRODUODENOSCOPY (EGD) WITH PROPOFOL; N/A     Comment:   Procedure: ESOPHAGOGASTRODUODENOSCOPY (EGD) WITH               PROPOFOL;  Surgeon: Midge Minium, MD;  Location: [facility]               SURGERY [facility];  Service: Endoscopy;  Laterality: N/A; 12/28/2023: ESOPHAGOGASTRODUODENOSCOPY (EGD) WITH PROPOFOL; N/A     Comment:  Procedure: ESOPHAGOGASTRODUODENOSCOPY (EGD) WITH BIOPSY;              Surgeon: Midge Minium, MD;  Location: [facility]               [facility];  Service: Endoscopy;  Laterality: N/A; 2005: HERNIA REPAIR; Left     Comment:  left inguinal No date: PROSTATE SURGERY No date: SHOULDER SURGERY; Left     Reproductive/Obstetrics negative OB ROS                             Anesthesia Physical Anesthesia Plan  ASA: 2  Anesthesia Plan: General LMA   Post-op Pain Management:    Induction: Intravenous  PONV Risk Score and Plan: Ondansetron, Dexamethasone, Midazolam and Treatment may vary due to age or medical condition  Airway Management Planned: LMA  Additional Equipment:   Intra-op Plan:   Post-operative Plan: Extubation in OR  Informed Consent: I have reviewed the patients History and Physical, chart, labs and discussed the procedure including the risks,  benefits and alternatives for the proposed anesthesia with the patient or authorized representative who has indicated his/her understanding and acceptance.     Dental Advisory Given  Plan Discussed with: Anesthesiologist, CRNA and Surgeon  Anesthesia Plan Comments: (Patient consented for risks of anesthesia including but not limited to:  - adverse reactions to medications - damage to eyes, teeth, lips or other oral mucosa - nerve damage due to positioning  - sore throat or hoarseness - Damage to heart, brain, nerves, lungs, other parts of body or loss of life  Patient voiced understanding and assent.)       Anesthesia Quick Evaluation

## 2024-02-09 NOTE — Interval H&P Note (Signed)
 History and Physical Interval Note:  02/09/2024 1:20 PM  Lucas Christensen  has presented today for surgery, with the diagnosis of Left Ureteral Stone.  The various methods of treatment have been discussed with the patient and family. After consideration of risks, benefits and other options for treatment, the patient has consented to  Procedure(s): CYSTOSCOPY/URETEROSCOPY/HOLMIUM LASER/STENT PLACEMENT (Left) as a surgical intervention.  The patient's history has been reviewed, patient examined, no change in status, stable for surgery.  I have reviewed the patient's chart and labs.  Questions were answered to the patient's satisfaction.    Preop urine culture grew mixed flora. CV: RRR Lungs: Clear  Aiva Miskell C Saadia Dewitt

## 2024-02-09 NOTE — Discharge Instructions (Signed)
 DISCHARGE INSTRUCTIONS FOR KIDNEY STONE/URETERAL STENT   MEDICATIONS:  1. Resume all your other meds from home.  2.  AZO (over-the-counter) can help with the burning/stinging when you urinate. 3.  Rx oxybutynin was sent to your pharmacy which will help with stent/bladder irritation.  Alfuzosin will also help with stent irritation. 4.  Pain medication was refilled if needed  ACTIVITY:  1. May resume regular activities in 24 hours. 2. No driving while on narcotic pain medications  3. Drink plenty of water  4. Continue to walk at home - you can still get blood clots when you are at home, so keep active, but don't over do it.  5. May return to work/school tomorrow or when you feel ready     SIGNS/SYMPTOMS TO CALL:  Common postoperative symptoms include urinary frequency, urgency, bladder spasm and blood in the urine  Please call us if you have a fever greater than 101.5, uncontrolled nausea/vomiting, uncontrolled pain, dizziness, unable to urinate, excessively bloody urine, chest pain, shortness of breath, leg swelling, leg pain, or any other concerns or questions.   You can reach [place] at [phone].   FOLLOW-UP:  1. You will be contacted for a follow-up appointment for stent removal in approximately 2 weeks

## 2024-02-09 NOTE — Transfer of Care (Signed)
 Immediate Anesthesia Transfer of Care Note  Patient: Lucas Christensen  Procedure(s) Performed: CYSTOSCOPY/URETEROSCOPY/HOLMIUM LASER/STENT PLACEMENT (Left: Ureter)  Patient Location: PACU  Anesthesia Type:General  Level of Consciousness: drowsy and patient cooperative  Airway & Oxygen Therapy: Patient Spontanous Breathing and Patient connected to face mask oxygen  Post-op Assessment: Report given to RN, Post -op Vital signs reviewed and stable, and Patient moving all extremities  Post vital signs: Reviewed and stable  Last Vitals:  Vitals Value Taken Time  BP 146/75 02/09/24 1509  Temp 37 C 02/09/24 1509  Pulse 70 02/09/24 1512  Resp 22 02/09/24 1512  SpO2 95 % 02/09/24 1512  Vitals shown include unfiled device data.  Last Pain:  Vitals:   02/09/24 1509  TempSrc:   PainSc: 0-No pain         Complications: No notable events documented.

## 2024-02-09 NOTE — Anesthesia Procedure Notes (Signed)
 Procedure Name: LMA Insertion Date/Time: 02/09/2024 1:33 PM  Performed by: Pricilla Handler, RNPre-anesthesia Checklist: Patient identified, Emergency Drugs available, Suction available, Patient being monitored and Timeout performed Patient Re-evaluated:Patient Re-evaluated prior to induction Oxygen Delivery Method: Circle System Utilized Preoxygenation: Pre-oxygenation with 100% oxygen Induction Type: IV induction Ventilation: Mask ventilation without difficulty LMA: LMA inserted LMA Size: 5.0 Number of attempts: 1 Airway Equipment and Method: Bite block Placement Confirmation: positive ETCO2 Tube secured with: Tape Dental Injury: Teeth and Oropharynx as per pre-operative assessment

## 2024-02-10 ENCOUNTER — Encounter: Payer: Self-pay | Admitting: Urology

## 2024-02-10 NOTE — Anesthesia Postprocedure Evaluation (Signed)
 Anesthesia Post Note  Patient: JEHAD BISONO  Procedure(s) Performed: CYSTOSCOPY/URETEROSCOPY/HOLMIUM LASER/STENT PLACEMENT (Left: Ureter)  Patient location during evaluation: PACU Anesthesia Type: General Level of consciousness: awake and alert Pain management: pain level controlled Vital Signs Assessment: post-procedure vital signs reviewed and stable Respiratory status: spontaneous breathing, nonlabored ventilation, respiratory function stable and patient connected to nasal cannula oxygen Cardiovascular status: blood pressure returned to baseline and stable Postop Assessment: no apparent nausea or vomiting Anesthetic complications: no   No notable events documented.   Last Vitals:  Vitals:   02/09/24 1545 02/09/24 1609  BP: (!) 155/87 (!) 153/88  Pulse: 74 74  Resp: 16 17  Temp: 37 C 37.1 C  SpO2: 92% 95%    Last Pain:  Vitals:   02/09/24 1609  TempSrc: Temporal  PainSc:                  Lenard Simmer

## 2024-02-15 ENCOUNTER — Telehealth: Payer: Self-pay | Admitting: *Deleted

## 2024-02-15 NOTE — Telephone Encounter (Signed)
 Patient walked in today and states he as pain . Getting up a lot in the middle of the night to urinate .Pain with urination.  He states he is going a lot. He is taking the Alfuzosin and oxybutynin . Talked to sam and we give him some gemteas sample to take 1 day

## 2024-02-16 NOTE — Op Note (Addendum)
 Preoperative diagnosis:  Left proximal ureteral calculus Left nephrolithiasis  Postoperative diagnosis:  Same  Procedure:  Cystoscopy Left ureteroscopy and stone removal Ureteroscopic laser lithotripsy Left ureteral stent placement (61F/24 cm)  Left retrograde pyelography with interpretation  Surgeon: Lorin Picket C. Briante Loveall, M.D.  Anesthesia: General  Complications: None  Intraoperative findings: Cystoscopy: Urethra normal in caliber without stricture.  Moderate lateral lobe enlargement.  Bladder mucosa without erythema, solid or papillary lesions.  UOs normal-appearing bilaterally Ureteropyeloscopy: Not impacted proximal ureteral calculus; 4 mm calculus left inferior calyx; multiple Randall's plaque, prominent papillae noted Left retrograde pyelography post procedure showed no filling defects, stone fragments or contrast extravasation  EBL: Minimal  Specimens: Calculus fragments for analysis   Indication: Lucas Christensen is a 73 y.o. male with a history of recurrent stone disease who recently presented complaining of severe left renal colic.  KUB showed migration of a previously noted lower pole calculus to the proximal ureter.  After discussing management options he has elected ureteroscopic removal. After reviewing the management options for treatment, the patient elected to proceed with the above surgical procedure(s). We have discussed the potential benefits and risks of the procedure, side effects of the proposed treatment, the likelihood of the patient achieving the goals of the procedure, and any potential problems that might occur during the procedure or recuperation. Informed consent has been obtained.  Description of procedure:  The patient was taken to the operating room and general anesthesia was induced.  The patient was placed in the dorsal lithotomy position, prepped and draped in the usual sterile fashion, and preoperative antibiotics were administered. A preoperative  time-out was performed.   A 21 French cystoscope was lubricated placed per urethra and advanced proximally into the bladder under direct vision.  Panendoscopy was then performed with findings described above  Attention was directed to the left ureteral orifice and a 0.038 Sensor wire was then advanced up the ureter into the renal pelvis under fluoroscopic guidance.  The cystoscope was removed and a single channel digital flexible ureteroscope was passed per urethra and inserted into the left distal ureter alongside the guidewire.  The ureteroscope was unable to be advanced through the distal ureter and a second Sensor wire was placed to the ureteroscope and advanced into the renal pelvis.  The ureteroscope was easily advanced over the wire.  Once in the proximal ureter the guidewire was removed and the ureteroscope was advanced until the stone was visualized.  A 200 m Moses holmium laser fiber was placed to the ureteroscope and the stone was dusted at a setting of 0.3J/80 Hz once reduced in size to prevent proximal migration a 1.9 [place] escape basket was placed proximal to the stone and open.  The stone was further dusted however migrated into the basket.  The ureteroscope/basket was then backed out of the ureter however hung in the distal ureter.  The basket was disassembled and the flexible ureteroscope was removed.  A 4.5 French semirigid ureteroscope was then placed per urethra and dusting was completed and the basket was removed.  The semirigid scope was repassed and no fragments were identified.  The semirigid scope was removed and the flexible ureteroscope was then repassed and advanced into the ureter.  No additional stone fragments were identified.  Retrograde pyelogram was performed and a filling defect was noted in a lower pole calyx at the site of a previously known stone.  Pyeloscopy was performed with findings as described above.  The lower calyceal calculus was dusted with  the holmium laser  fiber at a setting of 0.3J/80 Hz.  Repeat retrograde pyelogram showed no contrast extravasation.  All calyces were examined under fluoroscopic guidance and no additional calculi or stone fragments were identified.  The ureteroscope was removed under direct vision.  A 8F/24 cm Contour ureteral stent was placed under fluoroscopic guidance.  The wire was then removed with an adequate positioning noted in the renal pelvis as well as in the bladder.  The bladder was then emptied and the procedure ended.  The patient appeared to tolerate the procedure well and without complications.  After anesthetic reversal the patient was transported to the PACU in stable condition.   Plan: Schedule office cystoscopy with stent removal 7-10-days

## 2024-02-18 ENCOUNTER — Ambulatory Visit (INDEPENDENT_AMBULATORY_CARE_PROVIDER_SITE_OTHER): Admitting: Urology

## 2024-02-18 VITALS — BP 133/78 | HR 75 | Ht 70.0 in | Wt 195.0 lb

## 2024-02-18 DIAGNOSIS — Z466 Encounter for fitting and adjustment of urinary device: Secondary | ICD-10-CM

## 2024-02-18 DIAGNOSIS — Z792 Long term (current) use of antibiotics: Secondary | ICD-10-CM

## 2024-02-18 LAB — URINALYSIS, COMPLETE
Bilirubin, UA: NEGATIVE
Glucose, UA: NEGATIVE
Nitrite, UA: NEGATIVE
Specific Gravity, UA: 1.025 (ref 1.005–1.030)
Urobilinogen, Ur: 0.2 mg/dL (ref 0.2–1.0)
pH, UA: 6 (ref 5.0–7.5)

## 2024-02-18 LAB — CALCULI, WITH PHOTOGRAPH (CLINICAL LAB)
Calcium Oxalate Monohydrate: 100 %
Weight Calculi: 21 mg

## 2024-02-18 LAB — MICROSCOPIC EXAMINATION: RBC, Urine: >30 /HPF — AB (ref 0–2)

## 2024-02-18 MED ORDER — SULFAMETHOXAZOLE-TRIMETHOPRIM 800-160 MG PO TABS
1.0000 | ORAL_TABLET | Freq: Once | ORAL | Status: AC
  Administered 2024-02-18: 1 via ORAL

## 2024-02-18 NOTE — Progress Notes (Signed)
   Indications: Patient is 73 y.o., who is s/p ureteroscopic removal of a left proximal ureteral calculus and lower pole renal calculus.  No postbiopsy complaints; moderate stent irritation.  The patient is presenting today for stent removal.  Procedure:  Flexible Cystoscopy with stent removal ([address])  Timeout was performed and the correct patient, procedure and participants were identified.    Description:  The patient was prepped and draped in the usual sterile fashion. Flexible cystosopy was performed.  The stent was visualized, grasped, and removed intact without difficulty. The patient tolerated the procedure well.  A single dose of oral antibiotics was given.  Complications:  None  Plan:  Call for fever/flank pain post stent removal Stone analysis pending-will call with results Continue annual follow-up with KUB   Irineo Axon, MD

## 2024-02-20 ENCOUNTER — Telehealth: Admitting: Physician Assistant

## 2024-02-20 DIAGNOSIS — R300 Dysuria: Secondary | ICD-10-CM

## 2024-02-21 NOTE — Progress Notes (Signed)

## 2024-02-22 ENCOUNTER — Ambulatory Visit: Admitting: Physician Assistant

## 2024-02-22 ENCOUNTER — Telehealth: Payer: Self-pay

## 2024-02-22 ENCOUNTER — Encounter: Payer: Self-pay | Admitting: Physician Assistant

## 2024-02-22 VITALS — BP 125/82 | HR 79 | Temp 97.9°F | Ht 70.0 in | Wt 195.0 lb

## 2024-02-22 DIAGNOSIS — R350 Frequency of micturition: Secondary | ICD-10-CM

## 2024-02-22 LAB — BLADDER SCAN AMB NON-IMAGING

## 2024-02-22 MED ORDER — SULFAMETHOXAZOLE-TRIMETHOPRIM 800-160 MG PO TABS: 1.0000 | ORAL_TABLET | Freq: Two times a day (BID) | ORAL | 0 refills | Status: AC

## 2024-02-22 NOTE — Progress Notes (Unsigned)
 02/22/2024 2:23 PM   Lucas Christensen 1951/06/08 [id number]  CC: Chief Complaint  Patient presents with   Urinary Frequency   HPI: Lucas Christensen is a 73 y.o. male with PMH nephrolithiasis, elevated PSA, and BPH with LUTS s/p UroLift in 2018 on tadalafil 5 mg daily who underwent left ureteroscopy with laser lithotripsy and stent placement with Dr. Lonna Cobb 13 days ago with subsequent cystoscopy stent removal 4 days ago who presents today for evaluation of fevers.   Today he reports nighttime fevers, Tmax 103 F, hourly nocturia, weak stream, and malaise since stent removal.  Symptoms are consistent with past episodes of prostatitis.  He denies flank pain.  In-office UA today positive for trace ketones, 1+ blood, trace protein, and trace leukocytes; urine microscopy with 11-30 WBCs/HPF.  PVR 47 mL.  PMH: Past Medical History:  Diagnosis Date   Arthritis    left shoulder   Barrett esophagus    BPH (benign prostatic hyperplasia)    Calculus of both kidneys    Chronic prostatitis    Elevated PSA    GERD (gastroesophageal reflux disease)    Hiatal hernia    Hyperlipidemia    Prostatitis    Squamous cell skin cancer    Testicular hypofunction    Wears glasses     Surgical History: Past Surgical History:  Procedure Laterality Date   COLONOSCOPY WITH PROPOFOL N/A 09/13/2018   Procedure: COLONOSCOPY WITH PROPOFOL;  Surgeon: Midge Minium, MD;  Location: [facility];  Service: Endoscopy;  Laterality: N/A;   CYSTOSCOPY/URETEROSCOPY/HOLMIUM LASER/STENT PLACEMENT Left 02/09/2024   Procedure: CYSTOSCOPY/URETEROSCOPY/HOLMIUM LASER/STENT PLACEMENT;  Surgeon: Riki Altes, MD;  Location: [facility];  Service: Urology;  Laterality: Left;   ESOPHAGOGASTRODUODENOSCOPY (EGD) WITH PROPOFOL N/A 08/16/2020   Procedure: ESOPHAGOGASTRODUODENOSCOPY (EGD) WITH PROPOFOL;  Surgeon: Midge Minium, MD;  Location: [facility];  Service: Endoscopy;  Laterality: N/A;   ESOPHAGOGASTRODUODENOSCOPY  (EGD) WITH PROPOFOL N/A 10/07/2021   Procedure: ESOPHAGOGASTRODUODENOSCOPY (EGD) WITH PROPOFOL;  Surgeon: Midge Minium, MD;  Location: [facility];  Service: Endoscopy;  Laterality: N/A;   ESOPHAGOGASTRODUODENOSCOPY (EGD) WITH PROPOFOL N/A 12/28/2023   Procedure: ESOPHAGOGASTRODUODENOSCOPY (EGD) WITH BIOPSY;  Surgeon: Midge Minium, MD;  Location: [facility];  Service: Endoscopy;  Laterality: N/A;   HERNIA REPAIR Left 2005   left inguinal   PROSTATE SURGERY     SHOULDER SURGERY Left     Home Medications:  Allergies as of 02/22/2024       Reactions   Flomax [tamsulosin]    Dizzy        Medication List        Accurate as of February 22, 2024  2:23 PM. If you have any questions, ask your nurse or doctor.          ascorbic acid 500 MG tablet Commonly known as: VITAMIN C Take 500 mg by mouth.   multivitamin tablet Take 1 tablet by mouth daily.   pantoprazole 40 MG tablet Commonly known as: PROTONIX Take 1 tablet (40 mg total) by mouth daily.   Vitamin D3 125 MCG (5000 UT) Tabs Take 5,000 Units by mouth daily.        Allergies:  Allergies  Allergen Reactions   Flomax [Tamsulosin]     Dizzy     Family History: Family History  Problem Relation Age of Onset   Hypertension Mother    Heart attack Mother    Lung cancer Mother    Arthritis Father    Diabetes Father  Hypertension Sister    Multiple myeloma Sister    Diabetes Maternal Grandmother     Social History:   reports that he has never smoked. He has never used smokeless tobacco. He reports that he does not drink alcohol and does not use drugs.  Physical Exam: BP 125/82   Pulse 79   Temp 97.9 F (36.6 C) (Oral)   Ht 5\' 10"  (1.778 m)   Wt 195 lb (88.5 kg)   BMI 27.98 kg/m   Constitutional:  Alert and oriented, no acute distress, nontoxic appearing HEENT: [place], AT Cardiovascular: No clubbing, cyanosis, or edema Respiratory: Normal respiratory effort, no increased work of  breathing Skin: No rashes, bruises or suspicious lesions Neurologic: Grossly intact, no focal deficits, moving all 4 extremities Psychiatric: Normal mood and affect  Laboratory Data: Results for orders placed or performed in visit on 02/22/24  Microscopic Examination   Collection Time: 02/22/24  2:15 PM   Urine  Result Value Ref Range   WBC, UA 11-30 (A) 0 - 5 /hpf   RBC, Urine 0-2 0 - 2 /hpf   Epithelial Cells (non renal) 0-10 0 - 10 /hpf   Bacteria, UA None seen None seen/Few  Urinalysis, Complete   Collection Time: 02/22/24  2:15 PM  Result Value Ref Range   Specific Gravity, UA 1.025 1.005 - 1.030   pH, UA 5.5 5.0 - 7.5   Color, UA Yellow Yellow   Appearance Ur Hazy (A) Clear   Leukocytes,UA Trace (A) Negative   Protein,UA Trace Negative/Trace   Glucose, UA Negative Negative   Ketones, UA Trace (A) Negative   RBC, UA 1+ (A) Negative   Bilirubin, UA Negative Negative   Urobilinogen, Ur 0.2 0.2 - 1.0 mg/dL   Nitrite, UA Negative Negative   Microscopic Examination See below:   BLADDER SCAN AMB NON-IMAGING   Collection Time: 02/22/24  3:12 PM  Result Value Ref Range   Scan Result 47ml    Assessment & Plan:   1. Urinary frequency (Primary) UA notable for pyuria today and he is emptying appropriately.  He is afebrile, VSS in clinic and rather comfortable.  Will start empiric Bactrim DS twice daily x 14 days and send for culture for further evaluation.  He is in agreement with this plan. - Urinalysis, Complete - CULTURE, URINE COMPREHENSIVE - BLADDER SCAN AMB NON-IMAGING - sulfamethoxazole-trimethoprim (BACTRIM DS) 800-160 MG tablet; Take 1 tablet by mouth 2 (two) times daily for 14 days.  Dispense: 28 tablet; Refill: 0   Return if symptoms worsen or fail to improve.  Carman Ching, PA-C  [facility] [place] [address] [place], [place] [address] [phone]

## 2024-02-22 NOTE — Telephone Encounter (Signed)
 [name] Admin (supporting Riki Altes, MD)17 hours ago (3:10 PM    Appointment Request From: Lucas Christensen   With Provider: Riki Altes [facility] [place]]   Preferred Date Range: Any date 02/22/2024 or later   Preferred Times: Any Time   Reason for visit: Office Visit   Health Maintenance Topic:    Comments: I had a stent removed on Thursday, March 13th. Friday night and Saturday I ran a temperature of 100.9. I still have the increased urgency to urinate and difficulty. I was up every 40 minutes to 1 hour. Temperature Sunday afternoon is 99.4. It usually will increase in the late evening. I received the urine test results showing abnormal. I would greatly appreciate if I could come in Monday morning. I need some relief. Thanks   Received [employer] from patient from over the weekend. Appointment made for today at 2 pm.

## 2024-02-23 LAB — URINALYSIS, COMPLETE
Bilirubin, UA: NEGATIVE
Glucose, UA: NEGATIVE
Nitrite, UA: NEGATIVE
Specific Gravity, UA: 1.025 (ref 1.005–1.030)
Urobilinogen, Ur: 0.2 mg/dL (ref 0.2–1.0)
pH, UA: 5.5 (ref 5.0–7.5)

## 2024-02-23 LAB — MICROSCOPIC EXAMINATION: Bacteria, UA: NONE SEEN

## 2024-02-25 LAB — CULTURE, URINE COMPREHENSIVE

## 2024-03-07 ENCOUNTER — Telehealth: Payer: Self-pay

## 2024-03-07 NOTE — Telephone Encounter (Signed)
 Please see comments below-this if from [place] with the patient from admin pool/inbasket. Is there anything else the patient can do or should do at this time?       [name]  P [place] Admin (supporting [place], PA-C)19 minutes ago (2:28 PM)    Dr. Cheree Ditto prescribed hydroxyzine hcl 25 and Levocetirizine 5 mg. Not really sure if its helping or not. I stopped taking the antibiotic on Friday March 28. I have been on the rash medicine since Wednesday March 26th. Still have the rash and welts all over.    You  King Pinzon Massey41 minutes ago (2:07 PM)   Hi Mr Hellums,   We tried to call you earlier also and left you a message, per your voicemail you stated you saw dermatologist and was giving medication for your hives. What was the medication? Has this helped? Are the hives getting better?   You can also give [place] a call back.    Comments: I am having a severe reaction to the Sulfamethoxazole-tmp ds I was prescriped on 02/22/2024.I have large welts and a rash from head to toes. I stopped taking the medication on Friday 03/04/2024. The rash and welts have continued. I need to have this checked out ASAP. Thanks

## 2024-03-07 NOTE — Telephone Encounter (Signed)
 I also called and left patient a message earlier today asking him to call back to verify his DOB and phone number as he only left his name and there were several patients in the system with this name.  FYI

## 2024-03-09 NOTE — Telephone Encounter (Signed)
 IF he's still having prostatitis symptoms, ok to switch to Levaquin 500mg  daily x14 days. Otherwise no more abx needed. Yes, would do scheduled Benadryl/Zyrtec (cetirizine) and topical for rash. Please make sure no swelling of mouth, lips, tongue, or difficulty breathing, in which case he needs to go to the ED ---------------Per SV    [facility]

## 2024-03-10 NOTE — Telephone Encounter (Signed)
 Pt aware.   NO sx. Rash is gone. Added bactrim to allergy list.

## 2024-04-07 DIAGNOSIS — K2270 Barrett's esophagus without dysplasia: Secondary | ICD-10-CM

## 2024-04-12 NOTE — Addendum Note (Signed)
 Addended by: Lovie Rudder on: 04/12/2024 08:41 AM   Modules accepted: Orders

## 2024-06-15 ENCOUNTER — Encounter: Admitting: Family Medicine

## 2024-07-12 ENCOUNTER — Telehealth: Payer: Self-pay

## 2024-07-12 ENCOUNTER — Other Ambulatory Visit: Payer: Self-pay

## 2024-07-12 NOTE — Telephone Encounter (Signed)
 Pt call returned to schedule his colonoscopy with Dr. Jinny.  Chart reviewed.  His last colonoscopy was 09/13/2018 with Dr. Jinny.  Colonoscopy report noted to repeat in 10 years.  Pt was notified that his colonoscopy will be due in 2029.  No history of colon polyps, no GI issues at this time, no family history of colon cancer.  A copy of his colonoscopy report has been mailed to him for his records.  Thanks,  [place], CMA

## 2024-08-17 ENCOUNTER — Ambulatory Visit

## 2024-08-22 ENCOUNTER — Encounter: Payer: Self-pay | Admitting: Urology

## 2024-08-22 DIAGNOSIS — N401 Enlarged prostate with lower urinary tract symptoms: Secondary | ICD-10-CM

## 2024-08-23 MED ORDER — TADALAFIL 5 MG PO TABS: 5.0000 mg | ORAL_TABLET | Freq: Every day | ORAL | 11 refills | Status: AC

## 2024-11-09 ENCOUNTER — Ambulatory Visit

## 2025-01-17 ENCOUNTER — Ambulatory Visit: Admitting: Family Medicine

## 2025-01-31 ENCOUNTER — Ambulatory Visit

## 2025-02-17 ENCOUNTER — Ambulatory Visit: Admitting: Urology

## 2025-04-10 ENCOUNTER — Ambulatory Visit: Admitting: Family Medicine
